# Patient Record
Sex: Female | Born: 1945 | Race: White | Hispanic: No | Smoking: Former smoker
Health system: Southern US, Community
[De-identification: ages and names within clinical notes are randomized; demographics above are authoritative.]

## PROBLEM LIST (undated history)

## (undated) DIAGNOSIS — I10 Essential (primary) hypertension: Secondary | ICD-10-CM

## (undated) DIAGNOSIS — I739 Peripheral vascular disease, unspecified: Secondary | ICD-10-CM

## (undated) DIAGNOSIS — J42 Unspecified chronic bronchitis: Secondary | ICD-10-CM

## (undated) DIAGNOSIS — E785 Hyperlipidemia, unspecified: Secondary | ICD-10-CM

## (undated) HISTORY — DX: Hyperlipidemia, unspecified: E78.5

## (undated) HISTORY — PX: APPENDECTOMY: SHX54

## (undated) HISTORY — DX: Essential (primary) hypertension: I10

## (undated) HISTORY — DX: Peripheral vascular disease, unspecified: I73.9

## (undated) HISTORY — PX: ABDOMINAL HYSTERECTOMY: SHX81

## (undated) HISTORY — PX: EYE SURGERY: SHX253

## (undated) HISTORY — PX: ROTATOR CUFF REPAIR: SHX139

---

## 2004-11-03 ENCOUNTER — Ambulatory Visit (HOSPITAL_COMMUNITY): Admission: RE | Admit: 2004-11-03 | Discharge: 2004-11-03 | Payer: Self-pay | Admitting: Ophthalmology

## 2006-04-04 ENCOUNTER — Ambulatory Visit (HOSPITAL_COMMUNITY): Admission: RE | Admit: 2006-04-04 | Discharge: 2006-04-04 | Payer: Self-pay | Admitting: Ophthalmology

## 2010-12-16 ENCOUNTER — Encounter (INDEPENDENT_AMBULATORY_CARE_PROVIDER_SITE_OTHER): Payer: Medicare Other | Admitting: Vascular Surgery

## 2010-12-16 DIAGNOSIS — I70219 Atherosclerosis of native arteries of extremities with intermittent claudication, unspecified extremity: Secondary | ICD-10-CM

## 2010-12-20 NOTE — Consult Note (Signed)
NEW PATIENT CONSULTATION  Erin Edwards DOB:  11/23/1945                                       12/16/2010 CHART#:[id number]  REASON FOR CONSULTATION:  Bilateral leg claudication.  HISTORY OF PRESENT ILLNESS:  This is an 65 year old patient with complaint of bilateral leg pain.  She apparently is only able to ambulate less than a hundred yards before she begins to have pain in her calf and in her feet.  It appears to be left greater than right but at times is vice versa.  She was seen by Dr. Sherryll Burger at [facility] and it was felt that she had significant peripheral arterial disease and needed a referral.  She denies any rest pain or any type of gangrene or ischemic changes to her feet.  She does note occasionally a bluish hue to her feet which mainly she notes with cold exposure.  PAST MEDICAL HISTORY:  Diabetes, hypertension, and hyperlipidemia.  PAST SURGICAL HISTORY:  Includes an appendectomy, some type of node surgery for sinus disease, abdominal hysterectomy, cataract surgery, some type of ovarian cyst procedure and rotator cuff repair.  SOCIAL HISTORY:  A 40 pack year history of smoking.  Denies any alcohol or illicit drug use.  FAMILY HISTORY:  Mother died of breast cancer.  Father died of an MI and also had diabetes.  REVIEW OF SYSTEMS:  She noted joint pain, muscle pain and pain in the legs with walking.  Otherwise the rest of review of system was marked as negative on her chart.  MEDICATIONS:  She noted Wellbutrin, Flexeril, atorvastatin, Celexa, Cipro, lisinopril/hydrochlorothiazide.  ALLERGIES:  Include codeine, sulfa drugs and penicillin.  PHYSICAL EXAMINATION:  On physical examination, she had a blood pressure of 120/60, heart rate of 73, respirations were 22. GENERAL:  Well-developed, well-nourished, in no apparent distress, mildly obese. HEAD:  Normocephalic, atraumatic. ENT:  Hearing grossly intact.  Oropharynx without any erythema  or exudate.  Scant amounts caries.  Nares without any drainage or any erythema. NECK:  Supple neck.  No nuchal rigidity. EYES:  Pupils were equal, round, reactive to light.  Extraocular movements were intact. PULMONARY:  Symmetric expansion.  Good air movement.  No rales, rhonchi or wheezing. CARDIAC:  Regular rate and rhythm.  Normal S1, S2.  No murmurs, rubs, thrills, gallops. VASCULAR:  Easily palpable upper extremity pulses.  Bilateral femoral pulses.  I could not feel the popliteals.  There were easily palpable dorsalis pedis pulses.  I could not feel posterior tibial pulses, however, in this patient.  The aorta was not palpable due to her obesity.  Carotids were palpable easily.  There was a low intensity left carotid bruit. ABDOMEN:  Soft abdomen, nontender, nondistended.  No guarding, no rebound, no hepatosplenomegaly, obese. MUSCULOSKELETAL:  She had 5/5 strength in all extremities.  There were no findings of ischemia changes in either extremity.  However, she did have a cyanotic hue on bilateral soles. NEUROLOGIC:  Cranial nerves II-XII were intact.  Motor strength was as listed as above.  Sensation grossly intact in all extremities. PSYCH:  Judgment was intact.  Mood and affect were appropriate for her clinical situation. SKIN:  There are no obvious rashes. EXTREMITIES:  As listed above. LYMPHATICS:  No cervical, axillary or inguinal lymphadenopathy.  MEDICAL DECISION MAKING:  This is a 65 year old patient who presents with what is classically consistent with short  distance claudication. However, I do feel easily dorsalis pedis pulses bilaterally.  This patient additionally had a bruit in her neck and some symptomatology the near may not be consistent with possible left subclavian steal.  I have some suspicion that the outside ABIs may be inaccurate.  I also have no description of the waveforms, so I think it needs to be repeated.  So my recommendation is to proceed  first with bilateral lower extremity ABIs and bilateral carotid duplex and if that does indicate any disease process, round, then she may eventually need also angiogram to evaluate bilateral lower extremities and also possibly a left arm arterial duplex to evaluate for any arterial disease in the upper extremity.  I think also when she returns, I will send her off for screening labs to look for a possible etiology for possible Raynaud's syndrome in this patient. We discussed the management in her case.  She is going to start an aspirin.  Also we are going to start her on Pletal.  We discussed extensively the nature of a walking plan and we discussed the natural history of symptomatic intermittent claudication and all these studies hopefully will get done within the next 2-4 weeks and then will have her follow up afterwards.  I reiterated the importance of maximal medical management including smoking cessation.    Fransisco Hertz, MD Electronically Signed  BLC/MEDQ  D:  12/16/2010  Edwards:  12/20/2010  Job:  [id number]  cc:   Kirstie Peri, MD

## 2011-01-05 ENCOUNTER — Encounter: Payer: Self-pay | Admitting: Vascular Surgery

## 2011-01-18 NOTE — Progress Notes (Signed)
[facility] [place]  Established Intermittent Claudication  History of Present Illness  Erin Edwards is a 65 y.o. female who presents with chief complaint: B leg claudication and carotid bruit.  She presents today for her non-invasive studies and follow-up.  The patient's symptoms are stable.  The patient's symptoms are: short distance claudication that do not interfere with her ADLs.  The patient's treatment regimen currently included: maximal medical management and walking plan.  Past Medical History, Past Surgical History, Social History, Family History, Medications, Allergies, and Review of Systems are unchanged from previous evaluation on 12/16/10.  Physical Examination  Filed Vitals:   01/20/11 1017  BP: 134/72  Pulse: 100    General: A&O x 3, WDWN, obese  Pulmonary: Sym exp, good air movt, CTAB, no rales, rhonchi, & wheezing  Cardiac: RRR, Nl S1, S2, no Murmurs, rubs or gallops  Vascular: Vessel Right Left  Radial Palpable Palpable  Ulnary Palpable Palpable  Brachial Palpable Palpable  Carotid Palpable, without bruit Palpable, without bruit  Aorta Non-palpable N/A  Femoral Palpable Palpable  Popliteal Non-palpable Non-palpable  PT Palpable Palpable  DP Palpable Palpable   Musculoskeletal: M/S 5/5 throughout, Extremities without ischemic changes   Neurologic: Pain and light touch intact in extremities, Motor exam as listed above  Non-Invasive Vascular Imaging ABI (Date: 01/20/11)  RLE: 0.74, Biphasic DP & PT  LLE: 0.74, Biphasic DP & PT  Carotid Duplex (Date: 01/20/11)  No carotid stenosis Bilaterally  Bilateral VA antegrade and patent   Medical Decision Making  Erin Edwards is a 65 y.o. female who presents with: B leg intermittent claudication without evidence of critical limb ischemia.  She has no evidence of a carotid stenosis.   As the patient does not have life-style limiting claudication, I do not recommend intervention, as  the risks exceed the benefits.  I discussed with the patient the natural history of intermittent claudication: 75% of patients have stable or improved symptoms in a year an only 2% require amputation. Eventually 20% may require intervention in a year.  I discussed in depth with the patient the nature of atherosclerosis, and emphasized the importance of maximal medical management including strict control of blood pressure, blood glucose, and lipid levels, obtaining regular exercise, and cessation of smoking.  The patient is aware that without maximal medical management the underlying atherosclerotic disease process will progress, limiting the benefit of any interventions.  I discussed in depth with the patient a walking plan and how to execute such.  The patient is not interested in starting Pletal.  The patient will follow up in 6 month weeks with the following studies BLE ABI.    Thank you for allowing [place] to participate in this patient's care.  Leonides Sake, MD [facility] Office: [phone] Pager: [phone]

## 2011-01-20 ENCOUNTER — Encounter (INDEPENDENT_AMBULATORY_CARE_PROVIDER_SITE_OTHER): Payer: Medicare Other

## 2011-01-20 ENCOUNTER — Other Ambulatory Visit (INDEPENDENT_AMBULATORY_CARE_PROVIDER_SITE_OTHER): Payer: Medicare Other

## 2011-01-20 ENCOUNTER — Ambulatory Visit (INDEPENDENT_AMBULATORY_CARE_PROVIDER_SITE_OTHER): Payer: Medicare Other | Admitting: Vascular Surgery

## 2011-01-20 ENCOUNTER — Encounter: Payer: Self-pay | Admitting: Vascular Surgery

## 2011-01-20 VITALS — BP 134/72 | HR 100

## 2011-01-20 DIAGNOSIS — R0989 Other specified symptoms and signs involving the circulatory and respiratory systems: Secondary | ICD-10-CM

## 2011-01-20 DIAGNOSIS — I739 Peripheral vascular disease, unspecified: Secondary | ICD-10-CM

## 2011-01-20 NOTE — Progress Notes (Signed)
4 wk f/u for PVD and carotid bruit/ vasc labs done today: ABI's and Carotid Doppler

## 2011-02-02 NOTE — Procedures (Unsigned)
CAROTID DUPLEX EXAM  INDICATION:  Carotid bruit.  HISTORY: Diabetes:  Yes. Cardiac:  No. Hypertension:  Yes. Smoking:  Yes. Previous Surgery:  No. CV History:  The patient is currently asymptomatic. Amaurosis Fugax No, Paresthesias No, Hemiparesis No                                      RIGHT             LEFT Brachial systolic pressure:         161               153 Brachial Doppler waveforms:         WNL               WNL Vertebral direction of flow:        Antegrade         Antegrade DUPLEX VELOCITIES (cm/sec) CCA peak systolic                   77                87 ECA peak systolic                   114               120 ICA peak systolic                   80                69 ICA end diastolic                   28                24 PLAQUE MORPHOLOGY:                  Heterogeneous     Heterogeneous PLAQUE AMOUNT:                      Moderate          Moderate PLAQUE LOCATION:                    ICA               ICA  IMPRESSION:  Bilaterally no hemodynamically significant stenosis. Bilateral antegrade vertebrals.  ___________________________________________ Fransisco Hertz, MD  OD/MEDQ  D:  01/20/2011  T:  01/20/2011  Job:  [id number]

## 2011-07-21 ENCOUNTER — Ambulatory Visit: Payer: Medicare Other | Admitting: Vascular Surgery

## 2014-06-22 ENCOUNTER — Other Ambulatory Visit: Payer: Self-pay | Admitting: Surgery

## 2014-06-22 DIAGNOSIS — C50911 Malignant neoplasm of unspecified site of right female breast: Secondary | ICD-10-CM

## 2014-06-30 ENCOUNTER — Ambulatory Visit
Admission: RE | Admit: 2014-06-30 | Discharge: 2014-06-30 | Disposition: A | Discharge status: Home / Self Care | Payer: Medicare Other | Source: Ambulatory Visit | Attending: Surgery | Admitting: Surgery

## 2014-06-30 DIAGNOSIS — C50911 Malignant neoplasm of unspecified site of right female breast: Secondary | ICD-10-CM

## 2014-06-30 MED ORDER — GADOBENATE DIMEGLUMINE 529 MG/ML IV SOLN
19.0000 mL | Freq: Once | INTRAVENOUS | Status: AC | PRN
  Administered 2014-06-30: 19 mL via INTRAVENOUS

## 2014-07-01 ENCOUNTER — Other Ambulatory Visit: Payer: Self-pay | Admitting: Surgery

## 2014-07-01 DIAGNOSIS — R928 Other abnormal and inconclusive findings on diagnostic imaging of breast: Secondary | ICD-10-CM

## 2014-07-07 ENCOUNTER — Ambulatory Visit
Admission: RE | Admit: 2014-07-07 | Discharge: 2014-07-07 | Disposition: A | Discharge status: Home / Self Care | Payer: Medicare Other | Source: Ambulatory Visit | Attending: Surgery | Admitting: Surgery

## 2014-07-07 DIAGNOSIS — R928 Other abnormal and inconclusive findings on diagnostic imaging of breast: Secondary | ICD-10-CM

## 2014-07-07 MED ORDER — GADOBENATE DIMEGLUMINE 529 MG/ML IV SOLN
19.0000 mL | Freq: Once | INTRAVENOUS | Status: AC | PRN
  Administered 2014-07-07: 19 mL via INTRAVENOUS

## 2014-12-23 ENCOUNTER — Telehealth: Payer: Self-pay | Admitting: *Deleted

## 2014-12-23 NOTE — Telephone Encounter (Signed)
Received faxed referral from Dr. Jaclyn Prime office for patient to see Dr. Isidore Moos.  Called patient for appointment.  Patient states she has seen by Dr. Isidore Moos in [place] and has been released by Dr. Isidore Moos & Dr. Tressie Stalker.  LVM with Dr. Jaclyn Prime nurse with former information.

## 2016-01-25 IMAGING — MR MR RT BREAST BX W LOC DEV 1ST LESION IMAGE BX SPEC MR GUIDE
10 of 13 series · 33 of 48 positions shown · IV contrast (19ml Multihance)
Comparison: Previous exams.

ADDENDUM:
Pathology revealed atypical ductal hyperplasia in the right breast
and low grade ductal carcinoma in situ with calcifications in the
left breast. This was found to be concordant by Dr. Kusli Montazni.
Pathology was discussed with the patient by telephone. She reported
doing well after the biopsies with tenderness at the sites. Post
biopsy instructions and care were reviewed and her questions were
answered. She has previously been diagnosed with right breast ductal
carcinoma in situ and has seen Dr. Krish Sibrian in consultation.
The results of the biopsies were called and faxed to his office. She
is to follow her outlined treatment plan. My number was provided for
future questions or concerns.

Pathology results reported by Timm Landin RN, BSN on July 09, 2014.
CLINICAL DATA: 2.5 cm area of clumped non mass enhancement in the
posterior aspect of the lower outer quadrant of the right breast on
a recent staging breast MRI. Recently diagnosed ductal carcinoma in
situ in the anterior aspect of the outer right breast.
EXAM:
MRI GUIDED CORE NEEDLE BIOPSY OF THE RIGHT BREAST
TECHNIQUE: Multiplanar, multisequence MR imaging of the right breast was
performed both before and after administration of intravenous
contrast.
CONTRAST:  19 cc MultiHance

[Series 2: axial pre-cm · axial · non-contrast · 1.3mm · 0.99mm/px · z∈[-83,+103]mm · 3 of 144 slices shown]
[im 1/144]
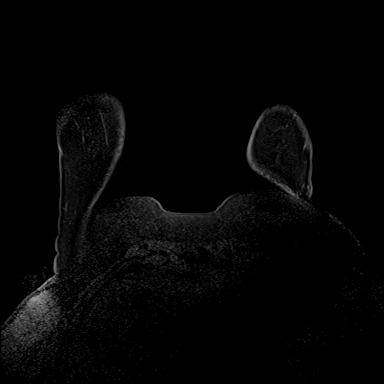
[im 72/144]
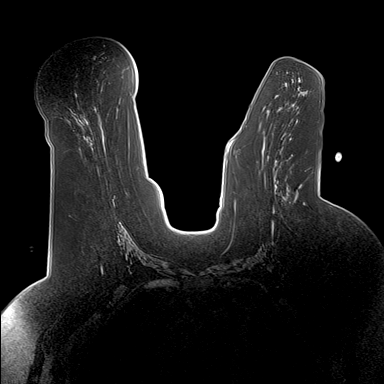
[im 144/144]
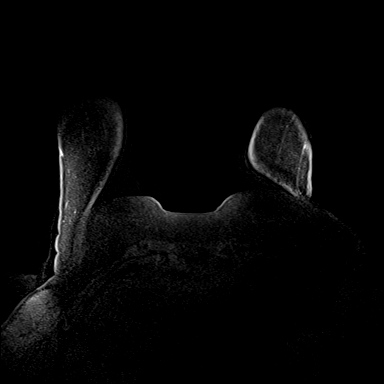

[Series 3: axial post 20 · axial · 1.3mm · 0.99mm/px · z∈[-83,+103]mm · 3 of 144 slices shown (1 of 2)]
[im 1/144]
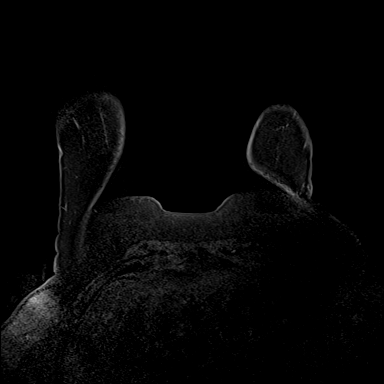
[im 72/144]
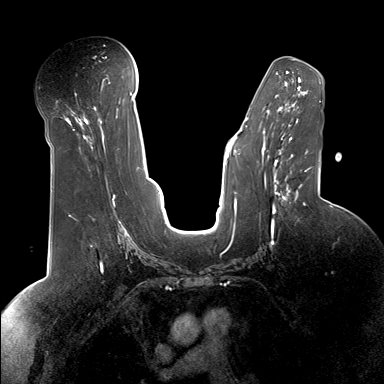
[im 144/144]
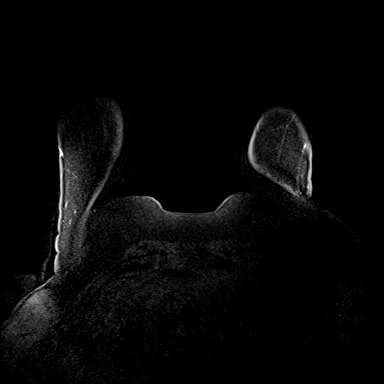

[Series 4: axial post 20 · axial · 1.3mm · 0.99mm/px · z∈[-83,+103]mm · 3 of 144 slices shown (2 of 2)]
[im 1/144]
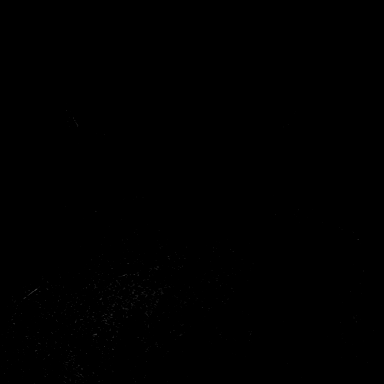
[im 72/144]
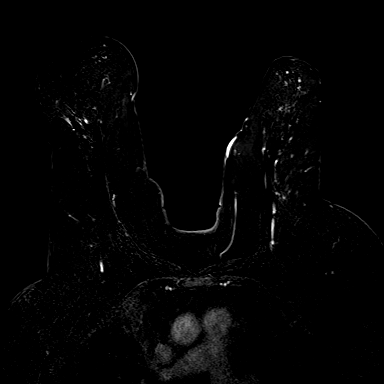
[im 144/144]
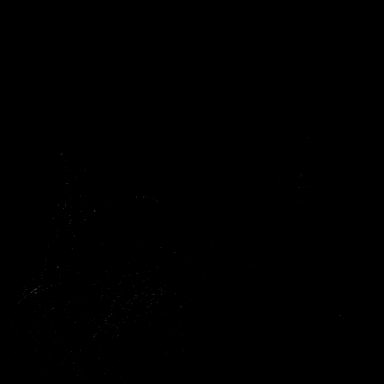

[Series 7: axial post 3 · axial · 1.3mm · 0.99mm/px · z∈[-83,+103]mm · 3 of 144 slices shown (1 of 2)]
[im 1/144]
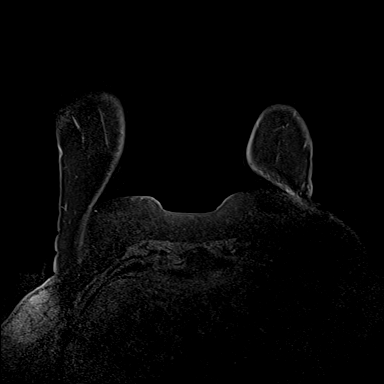
[im 72/144]
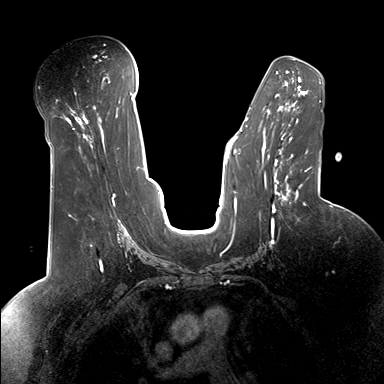
[im 144/144]
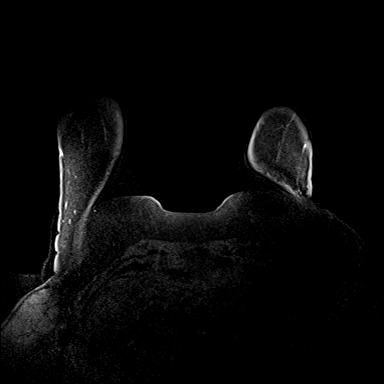

[Series 8: axial post 3 · axial · 1.3mm · 0.99mm/px · z∈[-83,+103]mm · 4 of 144 slices shown (2 of 2)]
[im 1/144]
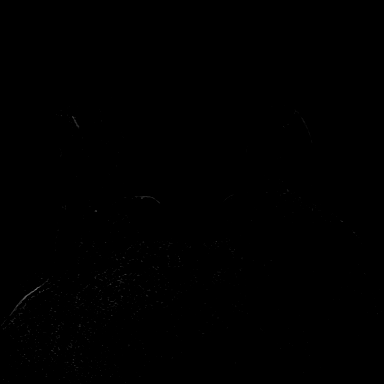
[im 48/144]
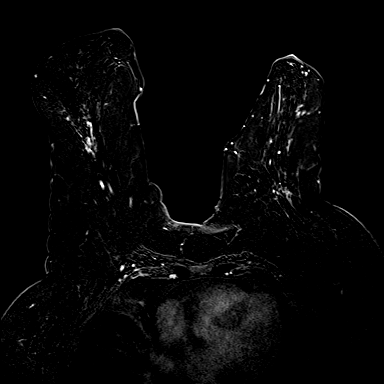
[im 96/144]
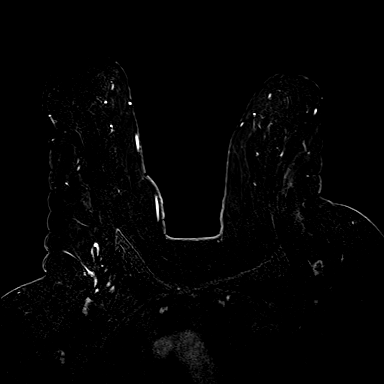
[im 144/144]
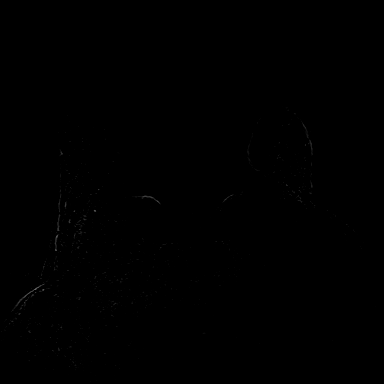

[Series 9: axial post 5 · axial · 1.3mm · 0.99mm/px · z∈[-83,+103]mm · 4 of 144 slices shown (1 of 2)]
[im 1/144]
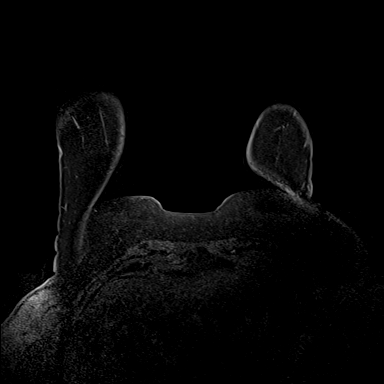
[im 48/144]
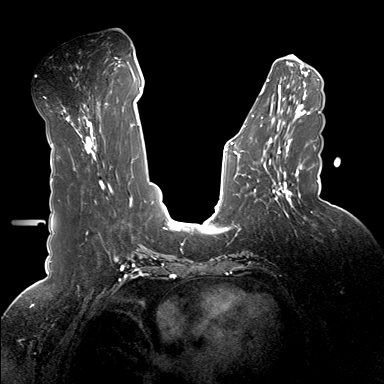
[im 96/144]
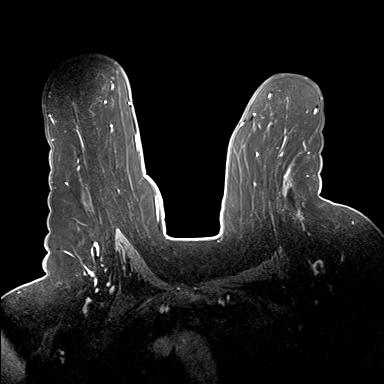
[im 144/144]
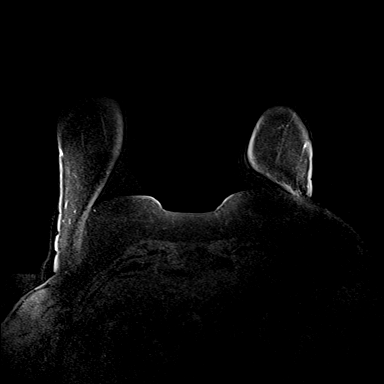

[Series 10: axial post 5 · axial · 1.3mm · 0.99mm/px · z∈[-83,+103]mm · 4 of 144 slices shown (2 of 2)]
[im 1/144]
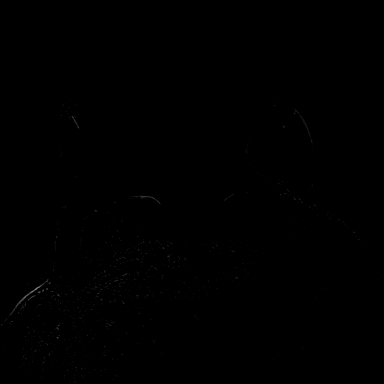
[im 48/144]
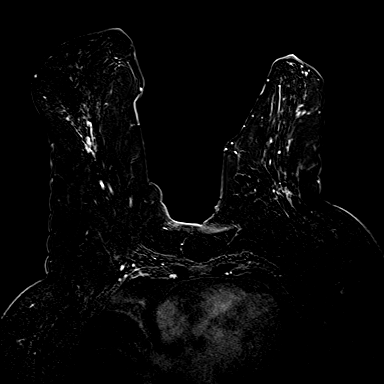
[im 96/144]
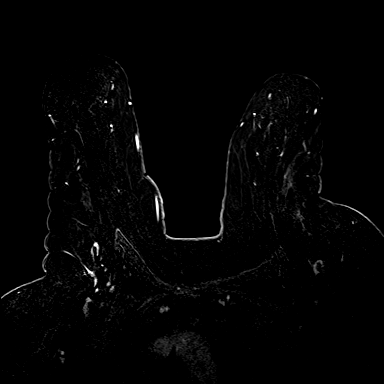
[im 144/144]
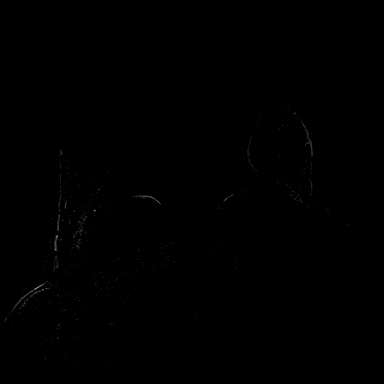

[Series 12: axial confirmation · axial · 1.3mm · 0.99mm/px · z∈[-83,+103]mm · 4 of 144 slices shown]
[im 1/144]
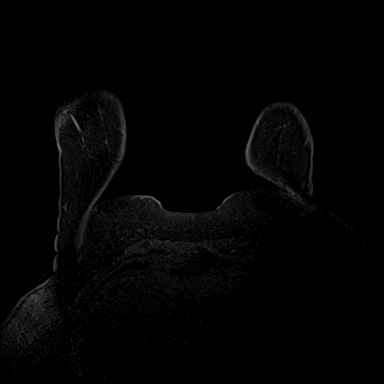
[im 48/144]
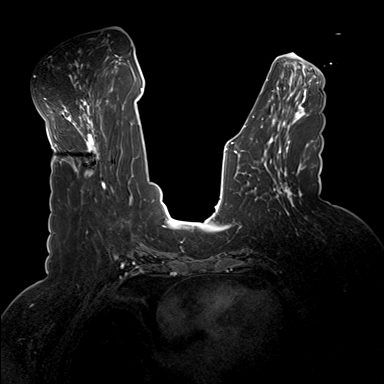
[im 96/144]
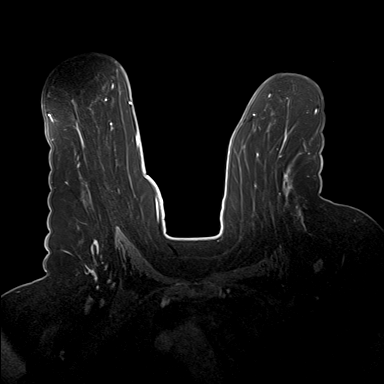
[im 144/144]
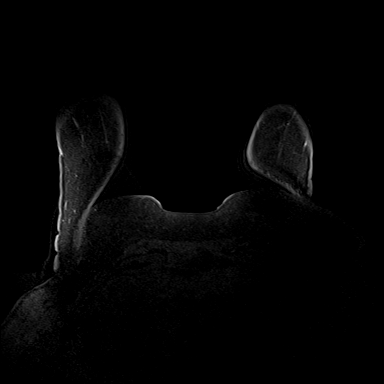

[Series 13: axial confirmation_sub · axial · 1.3mm · 0.99mm/px · z∈[-83,+103]mm · 4 of 144 slices shown]
[im 1/144]
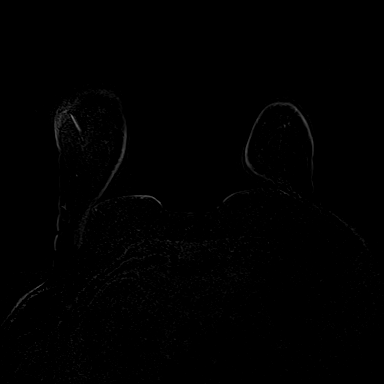
[im 48/144]
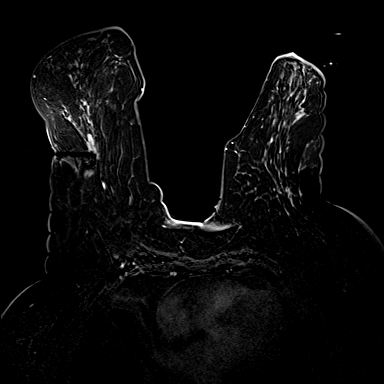
[im 96/144]
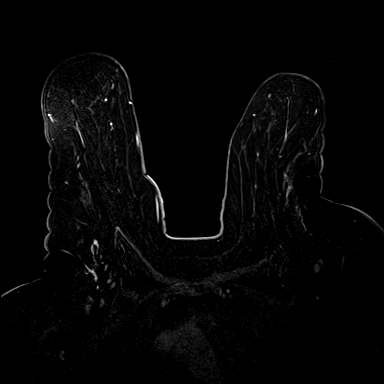
[im 144/144]
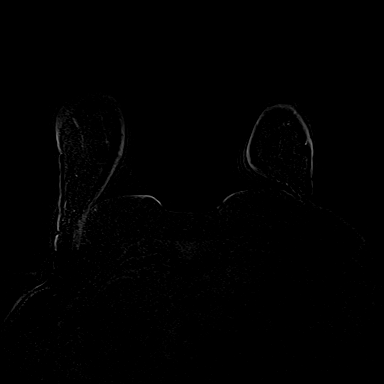

[Series 14: axial post clip · axial · 1.3mm · 0.99mm/px · 1 of 144 slices shown]
[im 1/144]
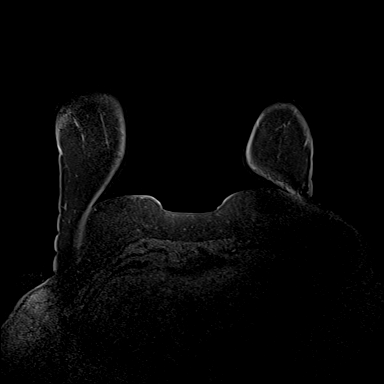

[33 of 48 positions shown; findings below may reference images not displayed]

FINDINGS: I met with the patient, and we discussed the procedure of MRI guided
biopsy, including risks, benefits, and alternatives. Specifically,
we discussed the risks of infection, bleeding, tissue injury, clip
migration, and inadequate sampling. Informed, written consent was
given. The usual time out protocol was performed immediately prior
to the procedure.

Using sterile technique, 2% Lidocaine, MRI guidance, and a 9 gauge
vacuum assisted device, biopsy was performed of the recently
demonstrated 2.5 cm area of clumped non mass enhancement in the
posterior aspect of the lower outer quadrant of the right breast
using a lateral approach. At the conclusion of the procedure, a
dumbbell-shaped tissue marker clip was deployed into the biopsy
cavity. Follow-up 2-view mammogram was performed and dictated
separately.
IMPRESSION: MRI guided biopsy of a 2.5 cm area of clumped non mass enhancement
in the posterior aspect of the lower outer quadrant of the right
breast. No apparent complications.

## 2016-04-26 ENCOUNTER — Ambulatory Visit (INDEPENDENT_AMBULATORY_CARE_PROVIDER_SITE_OTHER): Payer: Medicare Other | Admitting: Neurology

## 2016-04-26 ENCOUNTER — Encounter (INDEPENDENT_AMBULATORY_CARE_PROVIDER_SITE_OTHER): Payer: Self-pay | Admitting: Neurology

## 2016-04-26 DIAGNOSIS — R200 Anesthesia of skin: Secondary | ICD-10-CM | POA: Diagnosis not present

## 2016-04-26 DIAGNOSIS — G5620 Lesion of ulnar nerve, unspecified upper limb: Secondary | ICD-10-CM | POA: Insufficient documentation

## 2016-04-26 DIAGNOSIS — G5623 Lesion of ulnar nerve, bilateral upper limbs: Secondary | ICD-10-CM

## 2016-04-26 DIAGNOSIS — Z0289 Encounter for other administrative examinations: Secondary | ICD-10-CM

## 2016-04-26 NOTE — Progress Notes (Signed)
   STUDY DATE: 04/26/16 PATIENT NAME: Erin Edwards DOB: [birth year] MRN: [id number]  Referred by Dr. Monico Blitz [facility]; fax [phone])  HISTORY:  Erin Edwards is a 70 year old Left handed woman with numbness in the left 5th finger and adjacent palm and milder intermittent numbness in the same distribution on the right. On exam, she had 4/5 weakness in the ulnar innervated muscles of the left hand. She had positive Tinel signs at both elbows.  NERVE CONDUCTION STUDIES:  The left median motor response had normal distal latency, amplitude and conduction velocity. The left ulnar motor response had a normal distal latency but reduced amplitude and markedly reduced conduction velocity around the elbow. The left median sensory response was normal. The left ulnar sensory response was nonresponsive  The right median motor response had a normal distal latency, amplitude and conduction velocity. The right ulnar motor response had a normal distal latency and mildly reduced amplitude and mildly reduced conduction velocity around the elbow. The right median sensory response was normal. The right ulnar are in true response had a minimally delayed distal latency and moderately reduced amplitude.  Median and ulnar F-wave responses were normal.  EMG STUDIES:  Needle EMG of the left deltoid, triceps, biceps, flexor carpi ulnaris, extensor digitorum communis communis and abductor pollicis brevis showed no continuous activity and normal motor unit morphology and recruitment. The first dorsal interosseous (ulnar innervated) showed no spontaneous activity. There was an increased number of polyphasic motor units with neuropathic recruitment.  Needle EMG of the right abductor pollicis brevis and right first dorsal interosseous muscles showed no spontaneous activity and normal motor unit morphology and recruitment.  IMPRESSION:  This nerve conduction and EMG study showed the following: 1.   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   Ulnar nerve transposition could be considered.   2.   Mild right ulnar neuropathy at the elbow. 3.   No evidence of radiculopathy.   Mahoganie Basher A. Felecia Shelling, MD, PhD Certified in Neurology, [facility], Sleep Medicine, Pain Medicine and Neuroimaging  [facility] [address], [place], [place] [address] [phone]

## 2017-04-24 ENCOUNTER — Encounter: Payer: Self-pay | Admitting: Pulmonary Disease

## 2017-04-24 ENCOUNTER — Ambulatory Visit (INDEPENDENT_AMBULATORY_CARE_PROVIDER_SITE_OTHER): Payer: Medicare PPO | Admitting: Pulmonary Disease

## 2017-04-24 VITALS — BP 130/74 | HR 111 | Ht 62.0 in | Wt 193.0 lb

## 2017-04-24 DIAGNOSIS — R918 Other nonspecific abnormal finding of lung field: Secondary | ICD-10-CM

## 2017-04-24 DIAGNOSIS — J449 Chronic obstructive pulmonary disease, unspecified: Secondary | ICD-10-CM

## 2017-04-24 DIAGNOSIS — Z87891 Personal history of nicotine dependence: Secondary | ICD-10-CM

## 2017-04-24 NOTE — Progress Notes (Signed)
   Subjective:    Patient ID: Erin Edwards, female    DOB: 10/16/1945, 71 y.o.   MRN: [id number]  HPI    Review of Systems  Constitutional: Positive for unexpected weight change. Negative for fever.  HENT: Positive for dental problem. Negative for congestion, ear pain, nosebleeds, postnasal drip, rhinorrhea, sinus pressure, sneezing, sore throat and trouble swallowing.   Eyes: Negative for redness and itching.  Respiratory: Positive for cough, chest tightness, shortness of breath and wheezing.   Cardiovascular: Negative for palpitations and leg swelling.  Gastrointestinal: Positive for vomiting. Negative for nausea.  Genitourinary: Negative for dysuria.  Musculoskeletal: Negative for joint swelling.  Skin: Negative for rash.  Allergic/Immunologic: Negative.  Negative for environmental allergies, food allergies and immunocompromised state.  Neurological: Negative for headaches.  Hematological: Does not bruise/bleed easily.  Psychiatric/Behavioral: Negative for dysphoric mood. The patient is nervous/anxious.        Objective:   Physical Exam        Assessment & Plan:

## 2017-04-24 NOTE — Patient Instructions (Signed)
Will schedule PET scan  Follow up in 4 weeks with Dr. Halford Chessman or Nurse Practitioner

## 2017-04-24 NOTE — Progress Notes (Signed)
Past surgical history She  has a past surgical history that includes Appendectomy; Abdominal hysterectomy; Eye surgery; and Rotator cuff repair.  Family history Her family history includes Cancer in her mother; Diabetes in her father; Heart disease in her father.  Social history She  reports that she quit smoking about 3 years ago. Her smoking use included Cigarettes. She has a 75.00 pack-year smoking history. She has never used smokeless tobacco. She reports that she does not drink alcohol or use drugs.  Allergies  Allergen Reactions  . Codeine   . Penicillins   . Sulfa Antibiotics    ROS: 12 point ROS negative except below  Current Outpatient Prescriptions on File Prior to Visit  Medication Sig  . ACCU-CHEK AVIVA PLUS test strip   . ACCU-CHEK FASTCLIX LANCETS MISC   . atorvastatin (LIPITOR) 10 MG tablet Take 10 mg by mouth daily.    Marland Kitchen buPROPion (WELLBUTRIN) 100 MG tablet Take 100 mg by mouth 2 (two) times daily.    Marland Kitchen lisinopril (PRINIVIL,ZESTRIL) 40 MG tablet Take 40 mg by mouth daily.     No current facility-administered medications on file prior to visit.     Chief Complaint  Patient presents with  . pulm consult    pt referred by Dr. Manuella Ghazi for right upper lung mass. Pt has SOB generally and with exertion worsen over last month. Pt has productive cough, wheezing and trouble breathing.    Pulmonary test CT chest 04/12/17 >> CAD, Rt hilar mass with consolidation, small Rt effusion, hilar LAN, Rt 7th rib met  Past medical history She  has a past medical history of Claudication ([place]); Diabetes mellitus; Hyperlipidemia; and Hypertension.  Vital signs BP 130/74 (BP Location: Left Arm, Cuff Size: Normal)   Pulse (!) 111   Ht '5\' 2"'$  (1.575 m)   Wt 193 lb (87.5 kg)   SpO2 92%   BMI 35.30 kg/m   History of present illness Erin Edwards is a 71 y.o. female former smoker with with lung mass.  She was started getting more short of breath in August.  She also also have right  sided chest pain and cough.  She had CXR which showed Rt upper lung consolidation.  She was tx with prednisone and antibiotics.  F/u CXR showed persistence of consolidation.  She then had CT chest w/o contrast with details above.  She has history of breast cancer and was treated just with surgery.  Her husband had histoplasmosis in the 1990's.  She is from [place], but then lived in [place] before moving to [place].  Her son lives in [place].  She denies animal/bird exposure.  She quit smoking 3 yrs ago.  She denies fever, hemoptysis, change in vision, weight loss, or change in voice.  She has been using breo, but doesn't seem to be helping as much as before.    Physical exam  General - No distress Eyes - pupils reactive, wears glasses ENT - No sinus tenderness, no oral exudate, no LAN, no thyromegaly, TM clear, pupils equal/reactive, poor dentition Cardiac - s1s2 regular, no murmur, pulses symmetric Chest - decreased BS Rt upper lung field, mild tenderness along Rt chest Back - No focal tenderness Abd - Soft, non-tender, no organomegaly, + bowel sounds Ext - No edema Neuro - Normal strength, cranial nerves intact Skin - No rashes Psych - Normal mood, and behavior   Discussion 71 yo female former smoker with Rt hilar mass and concern for endobronchial lesion.  She also  has right hilar adenopathy, satellite Rt upper lung nodule, and probable metastatic lesion to Rt 7 th rib.  Main concern is for primary lung cancer.  I am less concerned that this is recurrence of breast cancer.  I explained it is unlikely this is related to her husbands history of histoplasmosis.   Assessment/plan  Lung mass. - will arrange for PET scan to better determine optimal approach for biopsy >> either bronchoscopy +/- EBUS or referral to interventional radiology - she will d/w her son about whether she would like to pursue biopsy and therapy in [place] or in [place] where she would have  more social support from family  COPD with chronic bronchitis. - continue breo   Patient Instructions  Will schedule PET scan  Follow up in 4 weeks with Dr. Halford Chessman or Nurse Practitioner    Chesley Mires, MD [place] [facility] Pager:  [phone] 04/24/2017, 12:38 PM

## 2017-05-04 ENCOUNTER — Encounter (HOSPITAL_COMMUNITY): Payer: Medicare PPO

## 2017-05-16 ENCOUNTER — Ambulatory Visit (HOSPITAL_COMMUNITY)
Admission: RE | Admit: 2017-05-16 | Discharge: 2017-05-16 | Disposition: A | Discharge status: Home / Self Care | Payer: Medicare PPO | Source: Ambulatory Visit | Attending: Pulmonary Disease | Admitting: Pulmonary Disease

## 2017-05-16 DIAGNOSIS — M899 Disorder of bone, unspecified: Secondary | ICD-10-CM | POA: Insufficient documentation

## 2017-05-16 DIAGNOSIS — R918 Other nonspecific abnormal finding of lung field: Secondary | ICD-10-CM | POA: Insufficient documentation

## 2017-05-16 DIAGNOSIS — E279 Disorder of adrenal gland, unspecified: Secondary | ICD-10-CM | POA: Insufficient documentation

## 2017-05-16 DIAGNOSIS — K769 Liver disease, unspecified: Secondary | ICD-10-CM | POA: Insufficient documentation

## 2017-05-16 DIAGNOSIS — R1900 Intra-abdominal and pelvic swelling, mass and lump, unspecified site: Secondary | ICD-10-CM | POA: Diagnosis not present

## 2017-05-16 DIAGNOSIS — M799 Soft tissue disorder, unspecified: Secondary | ICD-10-CM | POA: Insufficient documentation

## 2017-05-16 DIAGNOSIS — J189 Pneumonia, unspecified organism: Secondary | ICD-10-CM | POA: Insufficient documentation

## 2017-05-16 DIAGNOSIS — Z87891 Personal history of nicotine dependence: Secondary | ICD-10-CM | POA: Diagnosis present

## 2017-05-16 LAB — GLUCOSE, CAPILLARY
Glucose-Capillary: 173 mg/dL — ABNORMAL HIGH (ref 65–99)
Glucose-Capillary: 194 mg/dL — ABNORMAL HIGH (ref 65–99)

## 2017-05-16 MED ORDER — FLUDEOXYGLUCOSE F - 18 (FDG) INJECTION
9.6000 | Freq: Once | INTRAVENOUS | Status: AC | PRN
  Administered 2017-05-16: 9.6 via INTRAVENOUS

## 2017-05-18 ENCOUNTER — Telehealth: Payer: Self-pay | Admitting: Pulmonary Disease

## 2017-05-18 DIAGNOSIS — C3490 Malignant neoplasm of unspecified part of unspecified bronchus or lung: Secondary | ICD-10-CM

## 2017-05-18 NOTE — Telephone Encounter (Signed)
PET scan 05/16/17 >> 6.6 cm mass RUL 15.2 SUV, mediastinal LAN up to 10 mm SUV 4.6, Rt axillary LAN 1.3 cm SUV 6.9, 2.4 cm Lt lobe of liver SUV 8.4, 10 mm Lt adrenal SUV 4.6, Rt 7th rib SUV 11.2   Results d/w pt.  Will arrange for IR to assess biopsy of Rt axillary node of liver lesion.  If these aren't amenable to biopsy, will then arrange for bronchoscopy.

## 2017-05-22 ENCOUNTER — Ambulatory Visit (INDEPENDENT_AMBULATORY_CARE_PROVIDER_SITE_OTHER): Payer: Medicare PPO | Admitting: Adult Health

## 2017-05-22 ENCOUNTER — Encounter: Payer: Self-pay | Admitting: Adult Health

## 2017-05-22 DIAGNOSIS — R918 Other nonspecific abnormal finding of lung field: Secondary | ICD-10-CM | POA: Insufficient documentation

## 2017-05-22 NOTE — Patient Instructions (Signed)
Set up for CT Biopsy as discussed.  Once results are back will decide on next step.  Follow up with Dr. Halford Chessman  In 2 months and As needed

## 2017-05-22 NOTE — Progress Notes (Signed)
I have reviewed and agree with assessment/plan.  Chesley Mires, MD [facility] Pulmonary/Critical Care 05/22/2017, 3:35 PM Pager:  [phone]

## 2017-05-22 NOTE — Progress Notes (Signed)
_0  ID: Erin Edwards, female    DOB: 07/07/1946, 71 y.o.   MRN: [id number]  Chief Complaint  Patient presents with  . Follow-up    Referring provider: Monico Blitz, MD  HPI: 71 yo female former smoker seen for pulmonary consult for lung mass on 04/24/17    TEST  CT chest 04/12/17 >> CAD, Rt hilar mass with consolidation, small Rt effusion, hilar LAN, Rt 7th rib met PET scan 05/16/17 >> 6.6 cm mass RUL 15.2 SUV, mediastinal LAN up to 10 mm SUV 4.6, Rt axillary LAN 1.3 cm SUV 6.9, 2.4 cm Lt lobe of liver SUV 8.4, 10 mm Lt adrenal SUV 4.6, Rt 7th rib SUV 11.2   05/22/2017 Follow up : Lung mass  Patient presents for a 3-week follow-up.  Patient was seen last visit for abnormal CT chest that showed a right hilar mass with consolidation and small right pleural effusion.  Patient was set up for a PET scan that showed a 6.6 cm hypermetabolic right upper lobe mass positive mediastinal lymphadenopathy.  Hypermetabolic bone metastasis liver metastasis and left adrenal nodule.  Hypermetabolic left upper quadrant soft tissue density in the gastro splenic ligament consistent with metastatic disease.  There was several tiny subcentimeter hypermetabolic nodules in the left lower abdominal wall subcutaneous fat that were nonspecific. Pt is accompanied by her son from [place] . Pt lives alone and may need to go up to [place] for treatment . They are trying to get as much info as possible.  We went over the CT scan , PET scan results. Discussed need to move forward with biopsy .  Will try to get dx with CT guided biopsy of axillary lymph node (for staging )  She continues to have dry cough , dyspnea and weight loss.  No PFT on record.  Does have some rib and back pain .  No hemoptyiss , chest pain , orthopnea or edema. , no calf pain .    Allergies  Allergen Reactions  . Codeine   . Penicillins   . Sulfa Antibiotics     Immunization History  Administered Date(s) Administered  . Influenza Split  03/26/2015, 04/24/2016    Past Medical History:  Diagnosis Date  . Claudication ([place])   . Diabetes mellitus   . Hyperlipidemia   . Hypertension     Tobacco History: History  Smoking Status  . Former Smoker  . Packs/day: 1.50  . Years: 50.00  . Types: Cigarettes  . Quit date: 2015  Smokeless Tobacco  . Never Used   Counseling given: Not Answered   Outpatient Encounter Prescriptions as of 05/22/2017  Medication Sig  . ACCU-CHEK AVIVA PLUS test strip   . ACCU-CHEK FASTCLIX LANCETS MISC   . amlodipine-atorvastatin (CADUET) 10-10 MG tablet Take 1 tablet by mouth daily.  Marland Kitchen atorvastatin (LIPITOR) 10 MG tablet Take 10 mg by mouth daily.    Marland Kitchen buPROPion (WELLBUTRIN) 100 MG tablet Take 100 mg by mouth 2 (two) times daily.    . cholecalciferol (VITAMIN D) 400 units TABS tablet Take 400 Units by mouth.  . Dulaglutide (TRULICITY) 4.09 WJ/1.9JY SOPN Inject into the skin.  . fluticasone furoate-vilanterol (BREO ELLIPTA) 100-25 MCG/INH AEPB Inhale 1 puff into the lungs daily.  Marland Kitchen gabapentin (NEURONTIN) 300 MG capsule Take 300 mg by mouth 3 (three) times daily.  Marland Kitchen lisinopril (PRINIVIL,ZESTRIL) 40 MG tablet Take 40 mg by mouth daily.    . meloxicam (MOBIC) 15 MG tablet Take 15 mg by mouth daily.  Marland Kitchen  rosuvastatin (CRESTOR) 10 MG tablet Take 10 mg by mouth daily.  . sertraline (ZOLOFT) 100 MG tablet Take 100 mg by mouth daily.   No facility-administered encounter medications on file as of 05/22/2017.      Review of Systems  Constitutional:   No  weight loss, night sweats,  Fevers, chills,  +fatigue, or  lassitude.  HEENT:   No headaches,  Difficulty swallowing,  Tooth/dental problems, or  Sore throat,                No sneezing, itching, ear ache, nasal congestion, post nasal drip,   CV:  No chest pain,  Orthopnea, PND, swelling in lower extremities, anasarca, dizziness, palpitations, syncope.   GI  No heartburn, indigestion, abdominal pain, nausea, vomiting, diarrhea, change in bowel  habits, loss of appetite, bloody stools.   Resp:    No chest wall deformity  Skin: no rash or lesions.  GU: no dysuria, change in color of urine, no urgency or frequency.  No flank pain, no hematuria   MS:  +back pain    Physical Exam  There were no vitals taken for this visit.  GEN: A/Ox3; pleasant , NAD, obese    HEENT:  [place]/AT,  EACs-clear, TMs-wnl, NOSE-clear, THROAT-clear, no lesions, no postnasal drip or exudate noted. Poor dentition   NECK:  Supple w/ fair ROM; no JVD; normal carotid impulses w/o bruits; no thyromegaly or nodules palpated; no lymphadenopathy.    RESP  Clear  P & A; w/o, wheezes/ rales/ or rhonchi. no accessory muscle use, no dullness to percussion  CARD:  RRR, no m/r/g, no peripheral edema, pulses intact, no cyanosis or clubbing.  GI:   Soft & nt; nml bowel sounds; no organomegaly or masses detected.   Musco: Warm bil, no deformities or joint swelling noted.   Neuro: alert, no focal deficits noted.    Skin: Warm, no lesions or rashes    Lab Results:  CBC No results found for: WBC, RBC, HGB, HCT, PLT, MCV, MCH, MCHC, RDW, LYMPHSABS, MONOABS, EOSABS, BASOSABS  BMET No results found for: NA, K, CL, CO2, GLUCOSE, BUN, CREATININE, CALCIUM, GFRNONAA, GFRAA  BNP No results found for: BNP  ProBNP No results found for: PROBNP  Imaging: Nm Pet Image Initial (pi) Skull Base To Thigh  Result Date: 05/16/2017 CLINICAL DATA:  Initial treatment strategy for right lung mass. EXAM: NUCLEAR MEDICINE PET SKULL BASE TO THIGH TECHNIQUE: 9.6 mCi F-18 FDG was injected intravenously. Full-ring PET imaging was performed from the skull base to thigh after the radiotracer. CT data was obtained and used for attenuation correction and anatomic localization. FASTING BLOOD GLUCOSE:  Value: 174 mg/dl COMPARISON:  CT on 04/12/2017 FINDINGS: NECK:  No hypermetabolic lymph nodes or masses. CHEST: Central right upper lobe mass is seen which measures approximately 6.6 cm and is  hypermetabolic, with SUV max of 15.2. This is contiguous with the right hilum. Postobstructive pneumonitis is seen in the peripheral right upper lobe, which is also hypermetabolic. Small hypermetabolic right paratracheal, precarinal, and subcarinal lymph nodes are seen, largest in the right paratracheal region measuring 10 mm on image 57/4 with SUV max of 4.6. Tiny right pleural effusion shows no significant change or hypermetabolic activity. No suspicious left lung nodules seen on CT images. Stable postop changes from bilateral mastectomies. Mild right axillary lymphadenopathy noted, largest measuring 1.3 cm, with SUV max of 6.9. ABDOMEN/PELVIS: A subtle 2.4 cm low-attenuation lesion is seen in segment 3 of the left lobe, which appears new  since previous study, and has FDG uptake. SUV max measures 8.4. No other hypermetabolic liver lesions identified. Gallbladder is unremarkable. No evidence of biliary ductal dilatation. A 10 mm left adrenal nodule shows focal hypermetabolic activity with SUV max of 4.6. Mild right adrenal thickening again seen without associated hypermetabolic activity. A 2.1 cm hypermetabolic soft tissue density is seen in the upper gastrosplenic ligament on image 94/4. No hypermetabolic lymph nodes identified. There are several tiny sub-cm nodules seen in the left lower abdominal wall subcutaneous fat, with highest SUV max of 3.9. SKELETON: Hypermetabolic soft tissue mass is seen involving the right lateral seventh rib which measures 2.5 x 4.1 cm and has SUV max of 11.2. Other focal areas of hypermetabolism seen within the thoracic and lumbar spine and right iliac crest, consistent with bone metastases. IMPRESSION: Central right upper lobe hypermetabolic mass with involvement of the right hilum, consistent with bronchogenic carcinoma. Postobstructive pneumonitis in the right upper lobe. Small mediastinal lymph node metastases, and possible small right axillary lymph node metastases.  Hypermetabolic bone metastases. Hypermetabolic liver metastasis in the left lobe, and hypermetabolic left adrenal nodule suspicious for adrenal metastasis. Hypermetabolic left upper quadrant soft tissue density in the gastrosplenic ligament, consistent with metastatic disease. Several tiny sub-cm hypermetabolic nodules in left lower abdominal wall subcutaneous fat are nonspecific, however soft tissue metastases cannot be excluded. Electronically Signed   By: Earle Gell M.D.   On: 05/16/2017 14:27     Assessment & Plan:   Lung mass Large right lung mas -PET positive with most c/w metastatic lung cancer  -PET scan that showed a 6.6 cm hypermetabolic right upper lobe mass positive mediastinal lymphadenopathy.  Hypermetabolic bone metastasis liver metastasis and left adrenal nodule.  Hypermetabolic left upper quadrant soft tissue density in the gastro splenic ligament consistent with metastatic disease.  There was several tiny subcentimeter hypermetabolic nodules in the left lower abdominal wall subcutaneous fat that were nonspecific. Will set up for CT guided biopsy of axillary node for staging , if unable to obtain will need FOB/EBUS.  Support provided for pt and information for patient and son .  Once bx results will need to discuss at [facility]/refer to [facility]   Plan  Patient Instructions  Set up for CT Biopsy as discussed.  Once results are back will decide on next step.  Follow up with Dr. Halford Chessman  In 2 months and As needed          Rexene Edison, NP 05/22/2017

## 2017-05-22 NOTE — Assessment & Plan Note (Addendum)
Large right lung mas -PET positive with most c/w metastatic lung cancer  -PET scan that showed a 6.6 cm hypermetabolic right upper lobe mass positive mediastinal lymphadenopathy.  Hypermetabolic bone metastasis liver metastasis and left adrenal nodule.  Hypermetabolic left upper quadrant soft tissue density in the gastro splenic ligament consistent with metastatic disease.  There was several tiny subcentimeter hypermetabolic nodules in the left lower abdominal wall subcutaneous fat that were nonspecific. Will set up for CT guided biopsy of axillary node for staging , if unable to obtain will need FOB/EBUS.  Support provided for pt and information for patient and son .  Once bx results will need to discuss at [facility]/refer to [facility]   Plan  Patient Instructions  Set up for CT Biopsy as discussed.  Once results are back will decide on next step.  Follow up with Dr. Halford Chessman  In 2 months and As needed

## 2017-05-29 ENCOUNTER — Other Ambulatory Visit: Payer: Self-pay | Admitting: Student

## 2017-05-29 ENCOUNTER — Other Ambulatory Visit: Payer: Self-pay | Admitting: General Surgery

## 2017-05-30 ENCOUNTER — Ambulatory Visit (HOSPITAL_COMMUNITY)
Admission: RE | Admit: 2017-05-30 | Discharge: 2017-05-30 | Disposition: A | Discharge status: Home / Self Care | Payer: Medicare PPO | Source: Ambulatory Visit | Attending: General Surgery | Admitting: General Surgery

## 2017-05-30 ENCOUNTER — Encounter (HOSPITAL_COMMUNITY): Payer: Self-pay | Admitting: Emergency Medicine

## 2017-05-30 ENCOUNTER — Encounter (HOSPITAL_COMMUNITY): Payer: Self-pay

## 2017-05-30 ENCOUNTER — Ambulatory Visit (HOSPITAL_COMMUNITY)
Admission: RE | Admit: 2017-05-30 | Discharge: 2017-05-30 | Disposition: A | Discharge status: Home / Self Care | Payer: Medicare PPO | Source: Ambulatory Visit | Attending: Pulmonary Disease | Admitting: Pulmonary Disease

## 2017-05-30 ENCOUNTER — Inpatient Hospital Stay (HOSPITAL_COMMUNITY)
Admission: EM | Admit: 2017-05-30 | Discharge: 2017-06-01 | DRG: 166 | Disposition: A | Discharge status: Home / Self Care | Payer: Medicare PPO | Attending: Internal Medicine | Admitting: Internal Medicine

## 2017-05-30 ENCOUNTER — Other Ambulatory Visit: Payer: Self-pay

## 2017-05-30 DIAGNOSIS — I129 Hypertensive chronic kidney disease with stage 1 through stage 4 chronic kidney disease, or unspecified chronic kidney disease: Secondary | ICD-10-CM | POA: Diagnosis present

## 2017-05-30 DIAGNOSIS — N179 Acute kidney failure, unspecified: Secondary | ICD-10-CM | POA: Diagnosis present

## 2017-05-30 DIAGNOSIS — E119 Type 2 diabetes mellitus without complications: Secondary | ICD-10-CM | POA: Diagnosis not present

## 2017-05-30 DIAGNOSIS — J90 Pleural effusion, not elsewhere classified: Secondary | ICD-10-CM | POA: Diagnosis not present

## 2017-05-30 DIAGNOSIS — J440 Chronic obstructive pulmonary disease with acute lower respiratory infection: Secondary | ICD-10-CM | POA: Diagnosis present

## 2017-05-30 DIAGNOSIS — E1151 Type 2 diabetes mellitus with diabetic peripheral angiopathy without gangrene: Secondary | ICD-10-CM | POA: Diagnosis present

## 2017-05-30 DIAGNOSIS — C3490 Malignant neoplasm of unspecified part of unspecified bronchus or lung: Secondary | ICD-10-CM

## 2017-05-30 DIAGNOSIS — Z9010 Acquired absence of unspecified breast and nipple: Secondary | ICD-10-CM | POA: Diagnosis not present

## 2017-05-30 DIAGNOSIS — Z87891 Personal history of nicotine dependence: Secondary | ICD-10-CM

## 2017-05-30 DIAGNOSIS — J441 Chronic obstructive pulmonary disease with (acute) exacerbation: Secondary | ICD-10-CM | POA: Diagnosis present

## 2017-05-30 DIAGNOSIS — N189 Chronic kidney disease, unspecified: Secondary | ICD-10-CM | POA: Diagnosis present

## 2017-05-30 DIAGNOSIS — J181 Lobar pneumonia, unspecified organism: Secondary | ICD-10-CM | POA: Diagnosis present

## 2017-05-30 DIAGNOSIS — J188 Other pneumonia, unspecified organism: Secondary | ICD-10-CM | POA: Diagnosis not present

## 2017-05-30 DIAGNOSIS — K769 Liver disease, unspecified: Secondary | ICD-10-CM | POA: Diagnosis present

## 2017-05-30 DIAGNOSIS — J189 Pneumonia, unspecified organism: Secondary | ICD-10-CM

## 2017-05-30 DIAGNOSIS — E785 Hyperlipidemia, unspecified: Secondary | ICD-10-CM | POA: Diagnosis present

## 2017-05-30 DIAGNOSIS — R591 Generalized enlarged lymph nodes: Secondary | ICD-10-CM | POA: Diagnosis not present

## 2017-05-30 DIAGNOSIS — R0603 Acute respiratory distress: Secondary | ICD-10-CM | POA: Diagnosis not present

## 2017-05-30 DIAGNOSIS — R0602 Shortness of breath: Secondary | ICD-10-CM

## 2017-05-30 DIAGNOSIS — J9602 Acute respiratory failure with hypercapnia: Secondary | ICD-10-CM | POA: Diagnosis present

## 2017-05-30 DIAGNOSIS — R918 Other nonspecific abnormal finding of lung field: Secondary | ICD-10-CM

## 2017-05-30 DIAGNOSIS — Z853 Personal history of malignant neoplasm of breast: Secondary | ICD-10-CM | POA: Diagnosis not present

## 2017-05-30 DIAGNOSIS — C3411 Malignant neoplasm of upper lobe, right bronchus or lung: Secondary | ICD-10-CM | POA: Diagnosis not present

## 2017-05-30 DIAGNOSIS — E1122 Type 2 diabetes mellitus with diabetic chronic kidney disease: Secondary | ICD-10-CM | POA: Diagnosis present

## 2017-05-30 DIAGNOSIS — N289 Disorder of kidney and ureter, unspecified: Secondary | ICD-10-CM | POA: Diagnosis not present

## 2017-05-30 DIAGNOSIS — Z66 Do not resuscitate: Secondary | ICD-10-CM | POA: Diagnosis present

## 2017-05-30 DIAGNOSIS — I700 Atherosclerosis of aorta: Secondary | ICD-10-CM | POA: Diagnosis not present

## 2017-05-30 DIAGNOSIS — E210 Primary hyperparathyroidism: Secondary | ICD-10-CM | POA: Diagnosis present

## 2017-05-30 DIAGNOSIS — Z79899 Other long term (current) drug therapy: Secondary | ICD-10-CM

## 2017-05-30 DIAGNOSIS — T17908S Unspecified foreign body in respiratory tract, part unspecified causing other injury, sequela: Secondary | ICD-10-CM

## 2017-05-30 DIAGNOSIS — C7951 Secondary malignant neoplasm of bone: Secondary | ICD-10-CM | POA: Diagnosis present

## 2017-05-30 HISTORY — DX: Unspecified chronic bronchitis: J42

## 2017-05-30 LAB — URINALYSIS, ROUTINE W REFLEX MICROSCOPIC
Bilirubin Urine: NEGATIVE
GLUCOSE, UA: NEGATIVE mg/dL
Ketones, ur: NEGATIVE mg/dL
NITRITE: NEGATIVE
PROTEIN: 100 mg/dL — AB
Specific Gravity, Urine: 1.015 (ref 1.005–1.030)
pH: 5 (ref 5.0–8.0)

## 2017-05-30 LAB — CBC
HEMATOCRIT: 33.7 % — AB (ref 36.0–46.0)
HEMOGLOBIN: 10.6 g/dL — AB (ref 12.0–15.0)
MCH: 27.5 pg (ref 26.0–34.0)
MCHC: 31.5 g/dL (ref 30.0–36.0)
MCV: 87.5 fL (ref 78.0–100.0)
Platelets: 544 10*3/uL — ABNORMAL HIGH (ref 150–400)
RBC: 3.85 MIL/uL — AB (ref 3.87–5.11)
RDW: 15.6 % — ABNORMAL HIGH (ref 11.5–15.5)
WBC: 18 10*3/uL — ABNORMAL HIGH (ref 4.0–10.5)

## 2017-05-30 LAB — COMPREHENSIVE METABOLIC PANEL
ALBUMIN: 2.4 g/dL — AB (ref 3.5–5.0)
ALK PHOS: 73 U/L (ref 38–126)
ALT: 25 U/L (ref 14–54)
AST: 13 U/L — ABNORMAL LOW (ref 15–41)
Anion gap: 10 (ref 5–15)
BUN: 57 mg/dL — ABNORMAL HIGH (ref 6–20)
CALCIUM: 9 mg/dL (ref 8.9–10.3)
CO2: 22 mmol/L (ref 22–32)
CREATININE: 2.55 mg/dL — AB (ref 0.44–1.00)
Chloride: 107 mmol/L (ref 101–111)
GFR calc Af Amer: 21 mL/min — ABNORMAL LOW (ref >60)
GFR calc non Af Amer: 18 mL/min — ABNORMAL LOW (ref >60)
GLUCOSE: 152 mg/dL — AB (ref 65–99)
Potassium: 4 mmol/L (ref 3.5–5.1)
SODIUM: 139 mmol/L (ref 135–145)
Total Bilirubin: 0.6 mg/dL (ref 0.3–1.2)
Total Protein: 6.3 g/dL — ABNORMAL LOW (ref 6.5–8.1)

## 2017-05-30 LAB — I-STAT TROPONIN, ED: Troponin i, poc: 0.02 ng/mL (ref 0.00–0.08)

## 2017-05-30 LAB — I-STAT CG4 LACTIC ACID, ED
LACTIC ACID, VENOUS: 1.13 mmol/L (ref 0.5–1.9)
LACTIC ACID, VENOUS: 1.08 mmol/L (ref 0.5–1.9)

## 2017-05-30 LAB — GLUCOSE, CAPILLARY
GLUCOSE-CAPILLARY: 263 mg/dL — AB (ref 65–99)
Glucose-Capillary: 198 mg/dL — ABNORMAL HIGH (ref 65–99)
Glucose-Capillary: 205 mg/dL — ABNORMAL HIGH (ref 65–99)

## 2017-05-30 LAB — PROCALCITONIN: Procalcitonin: 11.79 ng/mL

## 2017-05-30 LAB — PROTIME-INR
INR: 1.28
Prothrombin Time: 15.9 seconds — ABNORMAL HIGH (ref 11.4–15.2)

## 2017-05-30 LAB — BRAIN NATRIURETIC PEPTIDE: B Natriuretic Peptide: 181 pg/mL — ABNORMAL HIGH (ref 0.0–100.0)

## 2017-05-30 LAB — APTT: aPTT: 30 seconds (ref 24–36)

## 2017-05-30 MED ORDER — ENOXAPARIN SODIUM 30 MG/0.3ML ~~LOC~~ SOLN
30.0000 mg | SUBCUTANEOUS | Status: DC
  Administered 2017-05-30: 30 mg via SUBCUTANEOUS
  Filled 2017-05-30: qty 0.3

## 2017-05-30 MED ORDER — IPRATROPIUM-ALBUTEROL 0.5-2.5 (3) MG/3ML IN SOLN
3.0000 mL | Freq: Once | RESPIRATORY_TRACT | Status: AC
  Administered 2017-05-30: 3 mL via RESPIRATORY_TRACT
  Filled 2017-05-30: qty 3

## 2017-05-30 MED ORDER — VANCOMYCIN HCL IN DEXTROSE 1-5 GM/200ML-% IV SOLN
1000.0000 mg | INTRAVENOUS | Status: DC
  Filled 2017-05-30: qty 200

## 2017-05-30 MED ORDER — VANCOMYCIN HCL 10 G IV SOLR
1500.0000 mg | Freq: Once | INTRAVENOUS | Status: AC
  Administered 2017-05-30: 1500 mg via INTRAVENOUS
  Filled 2017-05-30: qty 1500

## 2017-05-30 MED ORDER — LEVOFLOXACIN 500 MG PO TABS
500.0000 mg | ORAL_TABLET | Freq: Once | ORAL | Status: AC
  Administered 2017-05-30: 500 mg via ORAL
  Filled 2017-05-30: qty 1

## 2017-05-30 MED ORDER — PREDNISONE 20 MG PO TABS
40.0000 mg | ORAL_TABLET | Freq: Every day | ORAL | Status: DC
  Administered 2017-05-31 – 2017-06-01 (×2): 40 mg via ORAL
  Filled 2017-05-30 (×2): qty 2

## 2017-05-30 MED ORDER — LIDOCAINE HCL 1 % IJ SOLN
INTRAMUSCULAR | Status: AC
  Filled 2017-05-30: qty 20

## 2017-05-30 MED ORDER — METRONIDAZOLE 500 MG PO TABS
500.0000 mg | ORAL_TABLET | Freq: Two times a day (BID) | ORAL | Status: DC
  Administered 2017-05-30 – 2017-05-31 (×3): 500 mg via ORAL
  Filled 2017-05-30 (×3): qty 1

## 2017-05-30 MED ORDER — SODIUM CHLORIDE 0.9 % IV SOLN: INTRAVENOUS | Status: DC

## 2017-05-30 MED ORDER — PIPERACILLIN-TAZOBACTAM 3.375 G IVPB 30 MIN: 3.3750 g | Freq: Once | INTRAVENOUS | Status: DC

## 2017-05-30 MED ORDER — DEXTROSE 5 % IV SOLN
2.0000 g | Freq: Once | INTRAVENOUS | Status: AC
  Administered 2017-05-30: 2 g via INTRAVENOUS
  Filled 2017-05-30: qty 2

## 2017-05-30 MED ORDER — SODIUM CHLORIDE 0.9 % IV BOLUS (SEPSIS)
500.0000 mL | Freq: Once | INTRAVENOUS | Status: AC
  Administered 2017-05-30: 500 mL via INTRAVENOUS

## 2017-05-30 MED ORDER — DEXTROSE 5 % IV SOLN
2.0000 g | INTRAVENOUS | Status: DC
  Administered 2017-05-31: 2 g via INTRAVENOUS
  Filled 2017-05-30: qty 2

## 2017-05-30 MED ORDER — INSULIN ASPART 100 UNIT/ML ~~LOC~~ SOLN
0.0000 [IU] | Freq: Every day | SUBCUTANEOUS | Status: DC
  Administered 2017-05-30: 2 [IU] via SUBCUTANEOUS

## 2017-05-30 MED ORDER — IPRATROPIUM-ALBUTEROL 0.5-2.5 (3) MG/3ML IN SOLN
3.0000 mL | Freq: Three times a day (TID) | RESPIRATORY_TRACT | Status: DC
  Administered 2017-05-31 – 2017-06-01 (×5): 3 mL via RESPIRATORY_TRACT
  Filled 2017-05-30 (×5): qty 3

## 2017-05-30 MED ORDER — VANCOMYCIN HCL 10 G IV SOLR
1500.0000 mg | Freq: Once | INTRAVENOUS | Status: DC
  Filled 2017-05-30: qty 1500

## 2017-05-30 MED ORDER — IPRATROPIUM-ALBUTEROL 0.5-2.5 (3) MG/3ML IN SOLN
3.0000 mL | Freq: Four times a day (QID) | RESPIRATORY_TRACT | Status: DC
  Administered 2017-05-30 (×2): 3 mL via RESPIRATORY_TRACT
  Filled 2017-05-30 (×2): qty 3

## 2017-05-30 MED ORDER — INSULIN ASPART 100 UNIT/ML ~~LOC~~ SOLN
0.0000 [IU] | Freq: Three times a day (TID) | SUBCUTANEOUS | Status: DC
  Administered 2017-05-30 – 2017-05-31 (×2): 8 [IU] via SUBCUTANEOUS
  Administered 2017-06-01 (×2): 3 [IU] via SUBCUTANEOUS

## 2017-05-30 MED ORDER — PANTOPRAZOLE SODIUM 40 MG PO TBEC
40.0000 mg | DELAYED_RELEASE_TABLET | Freq: Every day | ORAL | Status: DC
  Administered 2017-05-30 – 2017-06-01 (×3): 40 mg via ORAL
  Filled 2017-05-30 (×3): qty 1

## 2017-05-30 MED ORDER — METHYLPREDNISOLONE SODIUM SUCC 125 MG IJ SOLR
125.0000 mg | Freq: Once | INTRAMUSCULAR | Status: AC
  Administered 2017-05-30: 125 mg via INTRAVENOUS
  Filled 2017-05-30: qty 2

## 2017-05-30 NOTE — Discharge Instructions (Addendum)
Needle Biopsy of the Lung, Care After °This sheet gives you information about how to care for yourself after your procedure. Your health care provider may also give you more specific instructions. If you have problems or questions, contact your health care provider. °What can I expect after the procedure? °After the procedure, it is common to have: °· Soreness, pain, and tenderness where a tissue sample was taken (biopsy site). °· A cough. °· A sore throat. ° °Follow these instructions at home: °Biopsy site care °· Follow instructions from your health care provider about when to remove the bandage that was placed on the biopsy site. °· Keep the bandage dry until it has been removed. °· Check your biopsy site every day for signs of infection. Check for: °? More redness, swelling, or pain. °? More fluid or blood. °? Warmth to the touch. °? Pus or a bad smell. °General instructions °· Rest as directed by your health care provider. Ask your health care provider what activities are safe for you. °· Do not take baths, swim, or use a hot tub until your health care provider approves. °· Take over-the-counter and prescription medicines only as told by your health care provider. °· If you have airplane travel scheduled, talk with your health care provider about when it is safe for you to travel by airplane. °· It is up to you to get the results of your procedure. Ask your health care provider, or the department that is doing the procedure, when your results will be ready. °· Keep all follow-up visits as told by your health care provider. This is important. °Contact a health care provider if: °· You have more redness, swelling, or pain around your biopsy site. °· You have more fluid or blood coming from your biopsy site. °· Your biopsy site feels warm to the touch. °· You have pus or a bad smell coming from your biopsy site. °· You have a fever. °· You have pain that does not get better with medicine. °Get help right away  if: °· You have problems breathing. °· You have chest pain. °· You cough up blood. °· You faint. °· You have a fast heart rate. °Summary °· After a needle biopsy of the lung, it is common to have a cough, a sore throat, or soreness, pain, and tenderness where a tissue sample was taken (biopsy site). °· You should check your biopsy area every day for signs of infection, including pus or a bad smell, warmth, more fluid or blood, or more redness, swelling, or pain. °· You should not take baths, swim, or use a hot tub until your health care provider approves. °· It is up to you to get the results of your procedure. Ask your health care provider, or the department that is doing the procedure, when your results will be ready. °This information is not intended to replace advice given to you by your health care provider. Make sure you discuss any questions you have with your health care provider. °Document Released: 05/07/2007 Document Revised: 05/31/2016 Document Reviewed: 05/31/2016 °Elsevier Interactive Patient Education © 2017 [employer] ° °Moderate Conscious Sedation, Adult, Care After °These instructions provide you with information about caring for yourself after your procedure. Your health care provider may also give you more specific instructions. Your treatment has been planned according to current medical practices, but problems sometimes occur. Call your health care provider if you have any problems or questions after your procedure. °What can I expect after the   procedure? °After your procedure, it is common: °· To feel sleepy for several hours. °· To feel clumsy and have poor balance for several hours. °· To have poor judgment for several hours. °· To vomit if you eat too soon. ° °Follow these instructions at home: °For at least 24 hours after the procedure: ° °· Do not: °? Participate in activities where you could fall or become injured. °? Drive. °? Use heavy machinery. °? Drink alcohol. °? Take sleeping  pills or medicines that cause drowsiness. °? Make important decisions or sign legal documents. °? Take care of children on your own. °· Rest. °Eating and drinking °· Follow the diet recommended by your health care provider. °· If you vomit: °? Drink water, juice, or soup when you can drink without vomiting. °? Make sure you have little or no nausea before eating solid foods. °General instructions °· Have a responsible adult stay with you until you are awake and alert. °· Take over-the-counter and prescription medicines only as told by your health care provider. °· If you smoke, do not smoke without supervision. °· Keep all follow-up visits as told by your health care provider. This is important. °Contact a health care provider if: °· You keep feeling nauseous or you keep vomiting. °· You feel light-headed. °· You develop a rash. °· You have a fever. °Get help right away if: °· You have trouble breathing. °This information is not intended to replace advice given to you by your health care provider. Make sure you discuss any questions you have with your health care provider. °Document Released: 04/30/2013 Document Revised: 12/13/2015 Document Reviewed: 10/30/2015 °Elsevier Interactive Patient Education © 2018 [employer] ° °

## 2017-05-30 NOTE — Consult Note (Signed)
Name: Erin Edwards MRN: [id number] DOB: 1946-01-23    ADMISSION DATE:  05/30/2017 CONSULTATION DATE: 11/7  REFERRING MD :  helberg  CHIEF COMPLAINT: Lung mass,  BRIEF PATIENT DESCRIPTION:  71 year old female patient who has been followed in the pulmonary clinic for newly found right hilar lung mass.  Initially diagnosed 9/20.  PET scan obtained 10/24 demonstrating 6.6 cm mass in the right upper lobe, and hypermetabolic activity involving the mediastinum, right axillary lymph node, left lobe of the liver, left adrenal gland, and right seventh rib.  She was to undergo CT-guided biopsy of the right seventh rib on 11/7.When evaluated by the interventional radiology staff she was found to be very tachypneic, tachycardic, chest x-ray showed worsening of right upper lobe airspace disease.  A CBC was obtained demonstrating leukocytosis.  Because of this it was felt unsafe to proceed with diagnostic procedure, she was brought to the emergency room for further evaluation.  Pulmonary asked to see for further recommendations in regards to cancer diagnostics as well as supportive therapy. SIGNIFICANT EVENTS   STUDIES:  CT chest 04/12/17 >> CAD, Rt hilar mass with consolidation, small Rt effusion, hilar LAN, Rt 7th rib met PET scan 05/16/17 >> 6.6 cm mass RUL 15.2 SUV, mediastinal LAN up to 10 mm SUV 4.6, Rt axillary LAN 1.3 cm SUV 6.9, 2.4 cm Lt lobe of liver SUV 8.4, 10 mm Lt adrenal SUV 4.6, Rt 7th rib SUV 11.2   HISTORY OF PRESENT ILLNESS:   71 year old female who is been followed by Dr. said since late September for right upper lobe lung mass.  Most recent PET scan obtained on 10/24 demonstrates hypermetabolic activity involving the mediastinum, the right axillary lymph node, left lobe of the liver, left adrenal gland, and 7 right rib.  As noted above she was at interventional radiology on 11/7 for planned CT-guided biopsy.  This was initially planned on being an lymph node biopsy, however after  radiology review it was decided to attempt rib biopsy.  On presentation found to be significantly short of breath so sent to the emergency room. On further questioning the patient reports ongoing chronic dyspnea really since July 2018, however abruptly worse over the last 7-10 days.  Her son who is at bedside verifies this.  She has had no recent sick exposure, no fever, no chills, no sinus congestion no sore throat.  She has had chronic dyspnea which is gotten worse over the last 7 days, this is been associated with increasing audible upper airway wheeze notable by standing in the room.  She has had a constant cough, but notes over the last week she is been awoke at night with a cough productive of what she thinks might be specks of blood.  She has chest pain associated with her cough, but not associated with activity.  Her activity tolerance has remarkably decreased so much so that she is actually fallen at home due to inability to tolerate much activity.  She notes that she is gotten much weaker.  She denies heart palpitations, dizziness, loss of consciousness.  She had no diarrhea, no constipation, however her weight has dropped almost 20 pounds since July, and she has had significant decrease in her appetite.  Chest x-ray evaluation in the emergency room does demonstrate known right upper lobe consolidation.  There is also marked increase airspace disease surrounding this area in comparison to prior film.  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  has a past surgical history that includes Appendectomy; Abdominal hysterectomy; Eye surgery; and Rotator cuff repair. Prior to Admission medications   Medication Sig Start Date End Date Taking? Authorizing Provider  amlodipine-atorvastatin (CADUET) 10-10 MG tablet     [provider]  atorvastatin (LIPITOR) 10 MG tablet     [provider]  buPROPion  (WELLBUTRIN) 100 MG tablet     [provider]  Dulaglutide (TRULICITY) 2.35 TD/3.2KG SOPN     [provider]  fluticasone furoate-vilanterol (BREO ELLIPTA) 100-25 MCG/INH AEPB     [provider]  gabapentin (NEURONTIN) 300 MG capsule     [provider]  lisinopril (PRINIVIL,ZESTRIL) 40 MG tablet     [provider]  rosuvastatin (CRESTOR) 10 MG tablet     [provider]  sertraline (ZOLOFT) 100 MG tablet     [provider]   Allergies  Allergen Reactions  . Codeine   . Penicillins   . Sulfa Antibiotics     FAMILY HISTORY:  family history includes Cancer in her mother; Diabetes in her father; Heart disease in her father. SOCIAL HISTORY:  reports that she quit smoking about 3 years ago. Her smoking use included cigarettes. She has a 75.00 pack-year smoking history. she has never used smokeless tobacco. She reports that she does not drink alcohol or use drugs.  REVIEW OF SYSTEMS:   Constitutional: Negative for fever, chills,weight loss, malaise/fatigue and diaphoresis.  HENT: Negative for hearing loss, ear pain, nosebleeds, congestion, sore throat, neck pain, tinnitus and ear discharge.   Eyes: Negative for blurred vision, double vision, photophobia, pain, discharge and redness.  Respiratory: Negative for cough,possible hemoptysis, sputum production, shortness of breath, wheezing and stridor.   Cardiovascular: Negative for chest pain, palpitations, orthopnea, claudication, leg swelling and PND.  Gastrointestinal: Negative for heartburn, nausea, vomiting, abdominal pain, diarrhea, constipation, blood in stool and melena. + bloating and GERD Genitourinary: Negative for dysuria, urgency, frequency, hematuria and flank pain.  Musculoskeletal: Negative for myalgias, back pain, joint pain and falls. Weakness Skin: Negative for itching and rash.  Neurological: Negative for dizziness, tingling, tremors, sensory change, speech  change, focal weakness, seizures, loss of consciousness, weakness and headaches.  Endo/Heme/Allergies: Negative for environmental allergies and polydipsia. Does not bruise/bleed easily.  SUBJECTIVE:  Short of breath, feels better sitting up straight VITAL SIGNS: Temp:  [97.7 F (36.5 C)-98.3 F (36.8 C)] 98.2 F (36.8 C) (11/07 0930) Pulse Rate:  [103-115] 106 (11/07 1345) Resp:  [20-33] 31 (11/07 1345) BP: (134-183)/(61-96) 152/72 (11/07 1245) SpO2:  [95 %-100 %] 99 % (11/07 1427) Weight:  [185 lb (83.9 kg)] 185 lb (83.9 kg) (11/07 0926)  PHYSICAL EXAMINATION: General: 71 year old Caucasian female sitting up in bed.  Tachypneic, some accessory muscle use, has difficulty completing full sentences Neuro: Awake, oriented, no focal deficits HEENT: Normocephalic atraumatic her mucous membranes are moist, there is no jugular venous distention Cardiovascular: Tach regular regular no murmur rub or gallop Lungs: Tachypneic, mild accessory muscle use, remarkable upper airway wheeze localized to the neck.  Some distant crackles on the right posterior Abdomen: Soft nontender no organomegaly no pain Musculoskeletal: Equal strength and bulk Skin: Warm dry intact brisk cap refill  Recent Labs  Lab 05/30/17 1030  NA 139  K 4.0  CL 107  CO2 22  BUN 57*  CREATININE 2.55*  GLUCOSE 152*   Recent Labs  Lab 05/30/17 0600  HGB 10.6*  HCT 33.7*  WBC 18.0*  PLT 544*  Dg Chest Port 1 View  Result Date: 05/30/2017 CLINICAL DATA:  Leukocytosis.  Dyspnea. EXAM: PORTABLE CHEST 1 VIEW COMPARISON:  03/24/2017 FINDINGS: The large central right upper lobe mass is larger. Opacity extending to the lateral right chest wall is associated. Heterogeneous opacities surrounding the mass extending towards the right apex have increased worrisome for postobstructive pneumonitis. A destructive lesion of a right lateral mid rib is worse with enlargement of the associated soft tissue mass. No Kerley B lines to  suggest volume overload. Vascularity is within normal limits. Heart is normal in size. No pneumothorax. No pleural effusion. IMPRESSION: Large right upper lobe mass is worse. Associated postobstructive pneumonitis is worse. No evidence of CHF. Electronically Signed   By: Marybelle Killings M.D.   On: 05/30/2017 08:58    ASSESSMENT / PLAN:   Right upper lobe lung mass with what appears to be widely metastatic disease involving the mediastinum, liver, left adrenal gland, and right seventh rib.  Now complicated by what appears to be postobstructive pneumonia  Discussion Acutely more short of breath than baseline, however has had baseline exertional dyspnea.  In the setting of tachycardia, leukocytosis, and tachypnea as well as debilitated state agree  with interventional radiology that conscious sedation would be dangerous in the patient's current state.  Plan/rec Admit for IV antibiotics Gentle IV hydration Add scheduled bronchodilators Add PPI, unclear how much of upper airway wheezing is reflux related versus could this reflect airway obstruction from tumor Pulse oximetry Wean oxygen as needed Procalcitonin algorithm Can reevaluate diagnostic options in regards to tissue sampling when she is more stable.  Ideally biopsy of either the rib, liver, adrenal gland, or lymph node would be best instead of bronchoscopy as would not require as heavy sedation and would give better staging information.  05/30/2017, 2:28 PM

## 2017-05-30 NOTE — Progress Notes (Addendum)
Pharmacy Antibiotic Note  Erin Edwards is a 71 y.o. female admitted on 05/30/2017 with post obstructive pneumonia.  Pharmacy has been consulted for cefepime and vancomycin dosing. Patient has received a dose of Cefepime 2gm IV in the ED. She has also been started on IV metronidazole for anaerobic coverage. WBC elevated at 18. SCr 2.55. nCrCl ~ 20 mL/min   Plan: -Cefepime 2 gm IV Q 24 hours -Vancomycin 1500 mg IV once, then vancomycin 1 gm IV Q 24 hours -Metronidazole 500 mg IV Q 12 hours per MD  -Monitor CBC, renal fx, cultures and clinical progree -VT at SS   Weight: 185 lb (83.9 kg)  Temp (24hrs), Avg:98.1 F (36.7 C), Min:97.7 F (36.5 C), Max:98.3 F (36.8 C)  Recent Labs  Lab 05/30/17 0600 05/30/17 1030 05/30/17 1046 05/30/17 1234  WBC 18.0*  --   --   --   CREATININE  --  2.55*  --   --   LATICACIDVEN  --   --  1.13 1.08    Estimated Creatinine Clearance: 20.5 mL/min (A) (by C-G formula based on SCr of 2.55 mg/dL (H)).    Allergies  Allergen Reactions  . Codeine   . Penicillins   . Sulfa Antibiotics     Antimicrobials this admission: Vanc 11/7 >>  Cefepime 11/7 >>   Dose adjustments this admission: None   Microbiology results: 11/7 BCx:  11/7 UCx:    Thank you for allowing pharmacy to be a part of this patient's care.  Albertina Parr, PharmD., BCPS Clinical Pharmacist Pager [phone]

## 2017-05-30 NOTE — ED Triage Notes (Addendum)
Pt reports from radiology where she was due to have a lung or rib biopsy done, pt has hx of COPD and lung Cancer. Pt was told she was short of breath, had a chest xray and was told she has pneumonia and needs to be admitted. Did not have procedure completed, and came to ED. 20G PIV in right hand. Pt has CBC and PT/INR PTT resulted form today but no BMP or CMP. Audible wheezing noted.

## 2017-05-30 NOTE — ED Notes (Addendum)
Pt c/o pain in the right arm a few inches above the IV site.  This RN assessed the site and no redness, swelling or signs of infiltration noted.  Pt states pain has subsided.  Will continue to monitor.

## 2017-05-30 NOTE — ED Notes (Signed)
Attempted report x1. 

## 2017-05-30 NOTE — H&P (Signed)
Date: 05/30/2017               Patient Name:  Erin Edwards MRN: [id number]  DOB: 1945/11/19 Age / Sex: 71 y.o., female   PCP: Monico Blitz, MD         Medical Service: Internal Medicine Teaching Service         Attending Physician: Dr. Bartholomew Crews, MD    First Contact: Dr. Tarri Abernethy Pager: [phone]  Second Contact: Dr. Philipp Ovens Pager: [phone]       After Hours (After 5p/  First Contact Pager: [phone]  weekends / holidays): Second Contact Pager: [phone]   Chief Complaint: Shortness of breath   History of Present Illness: Erin Edwards is a 71 y.o. Female with a PMHx significant for HTN, DM, COPD, tobacco abuse, and bilateral breast cancer s/p mastectomy who presented to the ED from interventional radiology with worsening shortness of breath.   The patient states that this all started in June when she presented to her PCP with worsening dyspnea. CXR illustrated findings consistent with pneumonia and she was prescribed antibiotics with steroids. She returned to her PCP 3 weeks later and repeat CXR again illustrated a right lobe infiltrate. She was treated with another round of antibiotics; however CXR again illustrate failure to resolve. CT chest was pursued that showed a right hilar mass with consolidation, a small right pleural effusion, hilar LAD, and right 7th rib metastasis. She was sent for evaluation by a pulmonologist. Her findings were consistent with primary lung cancer and PET scan was done. PET scan illustrated right upper lobe mass consistent with bronchogenic carcinoma, mediastinal lymph nodes, right axillary lymph nodes, bone metastases, liver lesions, left adrenal lesion, and lesion on the gastrosplenic ligament. She was then referred for interventional radiology for biopsy but found to have worsening dyspnea and was sent to the ED for further evaluation. Repeat chest x-ray showed worsening right upper lobe mass with associated postobstructive pneumonitis. For the last  4-5 days she has noticed worsening dyspnea and cough. She does report hemoptysis. Denies fevers, chills, constipation, abdominal pain, chest pain.   In the ED she found to be hemodynamically stable and she was started on Levaquin and Cefepime. Pertinent labs include creatinine 2.55, Ca 10.6, and leukocytosis. Urine analysis was significant for hematuria, pyuria, yeast, and calcium oxalate crystals.   Outpatient Medications:  She reports stopping all her outpatient medications approximately 4-6 months ago   Allergies: Allergies as of 05/30/2017 - Review Complete 05/30/2017  Allergen Reaction Noted  . Codeine  01/05/2011  . Penicillins  01/05/2011  . Sulfa antibiotics  01/05/2011   Past Medical History:  Diagnosis Date  . Claudication ([place])   . Diabetes mellitus   . Hyperlipidemia   . Hypertension    Family History: Mother: + Breast cancer (53s)  Social History:  Former smoker, quit 3 years ago. 75 pack year history  Denies EtOH or drug use  Review of Systems: A complete ROS was negative except as per HPI.   Physical Exam: Blood pressure (!) 152/72, pulse (!) 106, temperature 98.2 F (36.8 C), temperature source Oral, resp. rate (!) 31, weight 185 lb (83.9 kg), SpO2 98 %.  General: Obese female with increased work of breathing while on 2L/min   HENT: Normocephalic, atraumatic, moist mucus membranes  Pulm: Increased work of breathing, diffuse wheezing and crackles  CV: RRR, no murmurs, no rubs, no JVD Abdomen: Active bowel sounds, soft, non-distended, no tenderness to palpation  Extremities: Trace  pitting LE edema, pulses palpable in all extremities  Skin: No new skin lesions or rashes Neuro: Alert and oriented   EKG: personally reviewed: My interpretation is sinus arrhythmia, normal axis, normal intervals, no ST elevation or T wave inversion   CXR: personally reviewed: My interpretation is right upper lobe consolidation   Assessment & Plan by Problem: Active  Problems:   Postobstructive pneumonia  1. Postobstructive Pneumonia: Erin Edwards is a 71 y.o. Female who has signs and symptoms of primary lung cancer who presented to the ED with worsening symptoms. He newly developed hemoptysis, leukocytosis, and worsening SOB are consistent with pneumonia. Her structural lung disease, frequent antibiotic use, and imaging consistent with primary lung cancer place her at elevated risk for multidrug resistant organisms. She was initially treated with Levaquin and Cefepime. We will broaden antibiotic coverage to include pseudomonas and anaerobic organisms. We will consult pulmonology to evaluate doing a endobronchial biopsy vs CT guided biopsy.   - Broaden antibiotic coverage to Vancomycin, Metronidazole, and Cefepime  - Consulted Pulmonology  - Oxygen therapy to maintain oxygen saturation near 92% - Scheduled breathing treatments  - Start Prednisone 40 mg tomorrow   2. Right Upper Lobe Mass  - Follows with Dr. Halford Chessman - CT chest was pursued that showed a right hilar mass with consolidation, a small right pleural effusion, hilar LAD, and right 7th rib metastasis. - PET scan illustrated right upper lobe mass consistent with bronchogenic carcinoma, mediastinal lymph nodes, right axillary lymph nodes, bone metastases, liver lesions, left adrenal lesion, and lesion on the gastrosplenic ligament. - Consulted pulmonology for endobronchial biopsy vs Ct guided biopsy   3. COPD - Self reports history of COPD  - Oxygen therapy to maintain oxygen saturation near 92% - Scheduled breathing treatments  - Start Prednisone 40 mg tomorrow   4. Renal Dysfunction  - Unclear baseline  - Creatinine 2.55  - Urine analysis was significant for hematuria, pyuria, yeast, and calcium oxalate crystals. - Will treat if symptomatic with one time dose of fluconazole   5. Hypertension  - Continue to monitor  - Previously was on Amlodipine 10 mg and Lisinopril 40 mg  - Will restart  amlodipine is hypertensive   6. Diabetes Mellitus  - Moderate SSI TID with meals and night time coverage   Diet: Carb Modified  VTE ppx: Lovenox (renally adjusted)  Code Status: DNR  Dispo: Admit patient to Inpatient with expected length of stay greater than 2 midnights.  Signed: Ina Homes, MD 05/30/2017, 2:00 PM  My Pager: [phone]

## 2017-05-30 NOTE — ED Provider Notes (Signed)
[facility] DEPART[facility] Provider Note   CSN: [id number] Arrival date & time: 05/30/17  0907     History   Chief Complaint Chief Complaint  Patient presents with  . Shortness of Breath  . Pneumonia    HPI Erin Edwards is a 71 y.o. female.  The history is provided by the patient and a relative. No language interpreter was used.  Shortness of Breath   Pneumonia  Associated symptoms include shortness of breath.   Erin Edwards is a 71 y.o. female who presents to the Emergency Department complaining of sob.  She presents from interventional radiology for evaluation of pneumonia.  She has a history of chronic shortness of breath and is currently getting evaluated for a right lung mass.  She was scheduled to have a biopsy today when they referred her to the emergency department due to shortness of breath and pneumonia on her chest x-ray.  She reports progressive shortness of breath and dyspnea on exertion with cough productive of mucoid sputum with occasional purple specks.  Symptoms have been gradually progressing over the last week or 2.  She denies any chest pain, fevers.  She does have mild lower extremity swelling.  She is currently not on oxygen at home.  She reports the oxygen in the emergency department is improving her symptoms. Past Medical History:  Diagnosis Date  . Claudication ([place])   . Diabetes mellitus   . Hyperlipidemia   . Hypertension     Patient Active Problem List   Diagnosis Date Noted  . Postobstructive pneumonia 05/30/2017  . Lung mass 05/22/2017  . Numbness 04/26/2016  . 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 ABDOMINAL HYSTERECTOMY    . APPENDECTOMY    . EYE SURGERY     cataract removal  . ROTATOR CUFF REPAIR      OB History    No data available       Home Medications    Prior to Admission medications   Medication Sig Start Date End Date Taking? Authorizing  Provider  amlodipine-atorvastatin (CADUET) 10-10 MG tablet     [provider]  atorvastatin (LIPITOR) 10 MG tablet     [provider]  buPROPion (WELLBUTRIN) 100 MG tablet     [provider]  Dulaglutide (TRULICITY) 6.94 WN/4.6EV SOPN     [provider]  fluticasone furoate-vilanterol (BREO ELLIPTA) 100-25 MCG/INH AEPB     [provider]  gabapentin (NEURONTIN) 300 MG capsule     [provider]  lisinopril (PRINIVIL,ZESTRIL) 40 MG tablet     [provider]  rosuvastatin (CRESTOR) 10 MG tablet     [provider]  sertraline (ZOLOFT) 100 MG tablet     [provider]    Family History Family History  Problem Relation Age of Onset  . Cancer Mother        breast cancer  . Heart disease Father   . Diabetes Father     Social History Social History   Tobacco Use  . Smoking status: Former Smoker    Packs/day: 1.50    Years: 50.00    Pack years: 75.00    Types: Cigarettes    Last attempt to quit: 2015    Years since quitting: 3.8  . Smokeless tobacco: Never Used  Substance Use Topics  . Alcohol use: No  . Drug use: No     Allergies   Codeine;  Penicillins; and Sulfa antibiotics   Review of Systems Review of Systems  Respiratory: Positive for shortness of breath.   All other systems reviewed and are negative.    Physical Exam Updated Vital Signs BP (!) 163/73   Pulse (!) 125   Temp 98.2 F (36.8 C) (Oral)   Resp (!) 30   Wt 83.9 kg (185 lb)   SpO2 96%   BMI 33.30 kg/m   Physical Exam  Constitutional: She is oriented to person, place, and time. She appears well-developed and well-nourished.  HENT:  Head: Normocephalic and atraumatic.  Cardiovascular: Regular rhythm.  No murmur heard. Tachycardic  Pulmonary/Chest: Breath sounds normal. She is in respiratory distress.  Tachypnea with diffuse rhonchi and wheezes, right greater than left  Abdominal: Soft. There is no  tenderness. There is no rebound and no guarding.  Musculoskeletal:  Trace nonpitting edema to bilateral lower extremities  Neurological: She is alert and oriented to person, place, and time.  Skin: Skin is warm and dry.  Psychiatric: She has a normal mood and affect. Her behavior is normal.  Nursing note and vitals reviewed.    ED Treatments / Results  Labs (all labs ordered are listed, but only abnormal results are displayed) Labs Reviewed  COMPREHENSIVE METABOLIC PANEL - Abnormal; Notable for the following components:      Result Value   Glucose, Bld 152 (*)    BUN 57 (*)    Creatinine, Ser 2.55 (*)    Total Protein 6.3 (*)    Albumin 2.4 (*)    AST 13 (*)    GFR calc non Af Amer 18 (*)    GFR calc Af Amer 21 (*)    All other components within normal limits  BRAIN NATRIURETIC PEPTIDE - Abnormal; Notable for the following components:   B Natriuretic Peptide 181.0 (*)    All other components within normal limits  URINALYSIS, ROUTINE W REFLEX MICROSCOPIC - Abnormal; Notable for the following components:   APPearance CLOUDY (*)    Hgb urine dipstick LARGE (*)    Protein, ur 100 (*)    Leukocytes, UA SMALL (*)    Bacteria, UA RARE (*)    Squamous Epithelial / LPF 0-5 (*)    All other components within normal limits  CULTURE, BLOOD (ROUTINE X 2)  CULTURE, BLOOD (ROUTINE X 2)  URINE CULTURE  PROCALCITONIN  I-STAT TROPONIN, ED  I-STAT CG4 LACTIC ACID, ED  I-STAT CG4 LACTIC ACID, ED    EKG  EKG Interpretation  Date/Time:  Wednesday May 30 2017 09:27:39 EST Ventricular Rate:  113 PR Interval:    QRS Duration: 85 QT Interval:  367 QTC Calculation: 504 R Axis:   51 Text Interpretation:  Sinus tachycardia with irregular rate Abnormal R-wave progression, early transition Borderline T wave abnormalities Prolonged QT interval Confirmed by Quintella Reichert [phone]) on 05/30/2017 10:08:18 AM       Radiology Dg Chest Port 1 View  Result Date: 05/30/2017 CLINICAL DATA:   Leukocytosis.  Dyspnea. EXAM: PORTABLE CHEST 1 VIEW COMPARISON:  03/24/2017 FINDINGS: The large central right upper lobe mass is larger. Opacity extending to the lateral right chest wall is associated. Heterogeneous opacities surrounding the mass extending towards the right apex have increased worrisome for postobstructive pneumonitis. A destructive lesion of a right lateral mid rib is worse with enlargement of the associated soft tissue mass. No Kerley B lines to suggest volume overload. Vascularity is within normal limits. Heart is normal in size. No pneumothorax. No pleural  effusion. IMPRESSION: Large right upper lobe mass is worse. Associated postobstructive pneumonitis is worse. No evidence of CHF. Electronically Signed   By: Marybelle Killings M.D.   On: 05/30/2017 08:58    Procedures Procedures (including critical care time)  Medications Ordered in ED Medications  insulin aspart (novoLOG) injection 0-15 Units (not administered)  insulin aspart (novoLOG) injection 0-5 Units (not administered)  metroNIDAZOLE (FLAGYL) tablet 500 mg (not administered)  vancomycin (VANCOCIN) 1,500 mg in sodium chloride 0.9 % 500 mL IVPB (1,500 mg Intravenous New Bag/Given 05/30/17 1513)  ipratropium-albuterol (DUONEB) 0.5-2.5 (3) MG/3ML nebulizer solution 3 mL (3 mLs Nebulization Given 05/30/17 1426)  predniSONE (DELTASONE) tablet 40 mg (not administered)  vancomycin (VANCOCIN) IVPB 1000 mg/200 mL premix (not administered)  ceFEPIme (MAXIPIME) 2 g in dextrose 5 % 50 mL IVPB (not administered)  pantoprazole (PROTONIX) EC tablet 40 mg (not administered)  ceFEPIme (MAXIPIME) 2 g in dextrose 5 % 50 mL IVPB (0 g Intravenous Stopped 05/30/17 1246)  methylPREDNISolone sodium succinate (SOLU-MEDROL) 125 mg/2 mL injection 125 mg (125 mg Intravenous Given 05/30/17 1208)  ipratropium-albuterol (DUONEB) 0.5-2.5 (3) MG/3ML nebulizer solution 3 mL (3 mLs Nebulization Given 05/30/17 0959)  sodium chloride 0.9 % bolus 500 mL (0 mLs  Intravenous Stopped 05/30/17 1255)  levofloxacin (LEVAQUIN) tablet 500 mg (500 mg Oral Given 05/30/17 1315)     Initial Impression / Assessment and Plan / ED Course  I have reviewed the triage vital signs and the nursing notes.  Pertinent labs & imaging results that were available during my care of the patient were reviewed by me and considered in my medical decision making (see chart for details).     Patient with recent diagnosis of lung mass here for evaluation of worsening shortness of breath and increased work of breathing from reviewed labs that were drawn prior to ED arrival.  CBC with leukocytosis.  Providing treatment for pneumonia as well as reactive airway disease with steroids, Solu-Medrol, antibiotics.  BMP demonstrates renal insufficiency, unclear if this is acute or chronic.  Providing gentle fluid hydration.  Medicine consulted for admission for further treatment of postobstructive pneumonia.  Final Clinical Impressions(s) / ED Diagnoses   Final diagnoses:  Post-obstructive pneumonia due to foreign body aspiration    ED Discharge Orders    None       Quintella Reichert, MD 05/30/17 1552

## 2017-05-30 NOTE — Progress Notes (Signed)
Patient ID: Erin Edwards, female   DOB: 02/05/1946, 71 y.o.   MRN: [id number] Patient presented today for right lytic rib lesion biopsy however, she is having difficulty breathing and unable to complete a sentence without having to pause.  She is tachycardic and has an elevated WBC.  She states she does not feel well, but hasn't in months.  A PCXR was ordered which reveals worsening RUL mass with associated postobstructive pneumonitis.  Dr. Barbie Banner has discussed this patient with Dr. Halford Chessman.  Given her respiratory states, it is felt unsafe to proceed with an elective biopsy under sedation.  It has been requested she be taken to the ED for evaluation for further management.  PE: Heart: tachy Lungs: diffuse rhonchi with audible expiratory wheezing   [name] E 9:40 AM 05/30/2017

## 2017-05-31 ENCOUNTER — Inpatient Hospital Stay (HOSPITAL_COMMUNITY): Payer: Medicare PPO

## 2017-05-31 ENCOUNTER — Encounter (HOSPITAL_COMMUNITY): Payer: Self-pay | Admitting: Physician Assistant

## 2017-05-31 DIAGNOSIS — R0603 Acute respiratory distress: Secondary | ICD-10-CM

## 2017-05-31 DIAGNOSIS — N289 Disorder of kidney and ureter, unspecified: Secondary | ICD-10-CM

## 2017-05-31 DIAGNOSIS — I700 Atherosclerosis of aorta: Secondary | ICD-10-CM

## 2017-05-31 DIAGNOSIS — C7951 Secondary malignant neoplasm of bone: Secondary | ICD-10-CM

## 2017-05-31 DIAGNOSIS — J90 Pleural effusion, not elsewhere classified: Secondary | ICD-10-CM

## 2017-05-31 DIAGNOSIS — C3411 Malignant neoplasm of upper lobe, right bronchus or lung: Secondary | ICD-10-CM

## 2017-05-31 DIAGNOSIS — J441 Chronic obstructive pulmonary disease with (acute) exacerbation: Secondary | ICD-10-CM

## 2017-05-31 DIAGNOSIS — R918 Other nonspecific abnormal finding of lung field: Secondary | ICD-10-CM

## 2017-05-31 DIAGNOSIS — E119 Type 2 diabetes mellitus without complications: Secondary | ICD-10-CM

## 2017-05-31 DIAGNOSIS — E669 Obesity, unspecified: Secondary | ICD-10-CM

## 2017-05-31 DIAGNOSIS — J188 Other pneumonia, unspecified organism: Secondary | ICD-10-CM

## 2017-05-31 DIAGNOSIS — I10 Essential (primary) hypertension: Secondary | ICD-10-CM

## 2017-05-31 DIAGNOSIS — R591 Generalized enlarged lymph nodes: Secondary | ICD-10-CM

## 2017-05-31 LAB — CBC
HCT: 30.6 % — ABNORMAL LOW (ref 36.0–46.0)
HEMOGLOBIN: 9.3 g/dL — AB (ref 12.0–15.0)
MCH: 27.2 pg (ref 26.0–34.0)
MCHC: 30.4 g/dL (ref 30.0–36.0)
MCV: 89.5 fL (ref 78.0–100.0)
Platelets: 447 10*3/uL — ABNORMAL HIGH (ref 150–400)
RBC: 3.42 MIL/uL — ABNORMAL LOW (ref 3.87–5.11)
RDW: 15.7 % — ABNORMAL HIGH (ref 11.5–15.5)
WBC: 18.4 10*3/uL — ABNORMAL HIGH (ref 4.0–10.5)

## 2017-05-31 LAB — BASIC METABOLIC PANEL
Anion gap: 11 (ref 5–15)
BUN: 59 mg/dL — AB (ref 6–20)
CALCIUM: 8.7 mg/dL — AB (ref 8.9–10.3)
CO2: 19 mmol/L — AB (ref 22–32)
Chloride: 109 mmol/L (ref 101–111)
Creatinine, Ser: 2.96 mg/dL — ABNORMAL HIGH (ref 0.44–1.00)
GFR calc Af Amer: 17 mL/min — ABNORMAL LOW (ref >60)
GFR calc non Af Amer: 15 mL/min — ABNORMAL LOW (ref >60)
GLUCOSE: 189 mg/dL — AB (ref 65–99)
Potassium: 4.8 mmol/L (ref 3.5–5.1)
Sodium: 139 mmol/L (ref 135–145)

## 2017-05-31 LAB — SODIUM, URINE, RANDOM: Sodium, Ur: 75 mmol/L

## 2017-05-31 LAB — URINALYSIS, ROUTINE W REFLEX MICROSCOPIC
Bilirubin Urine: NEGATIVE
Glucose, UA: 50 mg/dL — AB
Ketones, ur: NEGATIVE mg/dL
LEUKOCYTES UA: NEGATIVE
Nitrite: NEGATIVE
PH: 5 (ref 5.0–8.0)
Protein, ur: 100 mg/dL — AB
SPECIFIC GRAVITY, URINE: 1.013 (ref 1.005–1.030)

## 2017-05-31 LAB — STREP PNEUMONIAE URINARY ANTIGEN: Strep Pneumo Urinary Antigen: NEGATIVE

## 2017-05-31 LAB — GLUCOSE, CAPILLARY
Glucose-Capillary: 196 mg/dL — ABNORMAL HIGH (ref 65–99)
Glucose-Capillary: 177 mg/dL — ABNORMAL HIGH (ref 65–99)

## 2017-05-31 LAB — CREATININE, URINE, RANDOM: CREATININE, URINE: 62.42 mg/dL

## 2017-05-31 MED ORDER — DEXTROSE 5 % IV SOLN
1.0000 g | INTRAVENOUS | Status: DC
  Administered 2017-06-01: 1 g via INTRAVENOUS
  Filled 2017-05-31: qty 1

## 2017-05-31 MED ORDER — VANCOMYCIN HCL IN DEXTROSE 1-5 GM/200ML-% IV SOLN
1000.0000 mg | INTRAVENOUS | Status: DC
  Administered 2017-06-01: 1000 mg via INTRAVENOUS
  Filled 2017-05-31: qty 200

## 2017-05-31 MED ORDER — SODIUM CHLORIDE 0.9 % IV SOLN
INTRAVENOUS | Status: DC
  Administered 2017-05-31 – 2017-06-01 (×3): via INTRAVENOUS

## 2017-05-31 MED ORDER — ENOXAPARIN SODIUM 30 MG/0.3ML ~~LOC~~ SOLN: 30.0000 mg | SUBCUTANEOUS | Status: DC

## 2017-05-31 MED ORDER — ALBUTEROL SULFATE (2.5 MG/3ML) 0.083% IN NEBU
2.5000 mg | INHALATION_SOLUTION | RESPIRATORY_TRACT | Status: DC | PRN
  Administered 2017-05-31 – 2017-06-01 (×2): 2.5 mg via RESPIRATORY_TRACT
  Filled 2017-05-31 (×2): qty 3

## 2017-05-31 MED ORDER — METRONIDAZOLE 500 MG PO TABS
500.0000 mg | ORAL_TABLET | Freq: Three times a day (TID) | ORAL | Status: DC
  Administered 2017-05-31 – 2017-06-01 (×4): 500 mg via ORAL
  Filled 2017-05-31 (×4): qty 1

## 2017-05-31 NOTE — Progress Notes (Signed)
PCCM Interval Note  I have reviewed the PET scan. Agree that although R mid-lung hypermetabolic lesion is a target, TTNA would be more straightforward and avoid some risk for resp complications.   Unless there is a well defined soft tissue component to the R 7th rib lesion, I recommend targeting the hepatic lesion in order to avoid need for decalcification of bony mass. This process can make molecular studies in pathology impossible. If there is a soft tissue chest wall target then either site would be good.   Recent Labs  Lab 05/30/17 0600  INR 1.28   Agree with broad abx with anaerobic coverage for post-obstructive PNA.   We will continue to follow.   Baltazar Apo, MD, PhD 05/31/2017, 9:32 AM [facility] [phone] or if no answer [phone]

## 2017-05-31 NOTE — Progress Notes (Signed)
  Date: 05/31/2017  Patient name: Erin Edwards  Medical record number: [id number]  Date of birth: 06-26-1946   I have seen and evaluated Erin Edwards and discussed their care with the Residency Team. Erin Edwards is a 71 year old female who presented to the ED from radiology. She had presented to radiology to have a biopsy but was found to be in respiratory distress and sent to the ED. In June, she had a chest x-ray consistent with pneumonia and was treated with antibiotics but did not improve. A repeat chest x-ray 3 weeks later continued to show the right upper lobe infiltrate and she was treated with a second round of antibiotics. After a second course of antibiotics. The infiltrate persisted and she had a CT scan that showed a right hilar mass, postobstructive pneumonitis, right upper lobe nodule, mediastinal lymphadenopathy, a right pleural effusion, hilar lymphadenopathy, and a right seventh rib met. She was scheduled to have a biopsy in IR on the day of admission. Today, she states she is no longer wheezing and feels a little bit better. She continues to have a cough productive of tissue/blood clot.  Vitals:   05/30/17 2205 05/31/17 0511  BP: (!) 140/59 (!) 136/97  Pulse: (!) 119 86  Resp: 18 18  Temp: 98.6 F (37 C) (!) 97.5 F (36.4 C)  SpO2: 96% 99%  Gen : Obese, lying in bed Resp : using accessory muscle, bc dyspnic with speaking, unable to speak in full sentences. + wheezing B HRRR no MRG ABD + BS, soft  Cr 2.96 Alb 2.4 WBC 18.0 - 18.4 HgB 9.3 Plts 447 Corrected calcium about 10  I personally viewed the CXR images and confirmed my reading with the official read. 1 view, aortic atherosclerosis, consolidation of the right upper lobe  I personally viewed the EKG and confirmed my reading with the official read. Sinus arrhythmia, normal axis, no ischemic changes  Assessment and Plan: I have seen and evaluated the patient as outlined above. I agree with the formulated  Assessment and Plan as detailed in the residents' note, with the following changes: Erin Edwards is a 71 year old female who failed antibiotic treatment 2 for a presumed right upper lobe pneumonia. CT imaging revealed what is likely a primary lung cancer with post obstruction pneumonia. She continues to be in mild non-hypoxic respiratory distress. She is on broad spectrum antibiotics to cover MRSA, pseudomonas, and anaerobes. Her regimen includes vancomycin, cefepime, metronidazole. Since she has wheezing bilaterally, we are using prednisone 40 mg for 5 days to treat a presumed superimposed COPD exacerbation. We are working with IR to obtain tissue diagnosis so that she can then discuss her options with oncology.  1. Acute non-hypoxic respiratory failure  2. Postobstructive pneumonia  3. Superimposed COPD exacerbation Continue antibiotics, oxygen, prednisone, nebulizers  4. Right upper lobe mass - this is highly suspicious for metastatic lung cancer. We are obtaining tissue via IR. We will need to involve oncology once we have a tissue diagnosis.  5. Hypercalcemia - hypercalcemia is only mildly elevated & she is asymptomatic. 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 We will start with a PTH and calcium to help determine if this is PTH mediated or non-PTH mediated.  Bartholomew Crews, MD 11/8/201812:05 PM

## 2017-05-31 NOTE — Progress Notes (Signed)
   Subjective: Doing well this AM. Her SOB has improved but she continues to be dyspneic with conversation. She had another episode of hemoptysis with tissue production. We discussed saving the sample the next time it occurs. She will try to remember. We will touch base with IR today to see if we can get a biopsy while she is in the hospital. All questions and concerns addressed.   Objective: Vital signs in last 24 hours: Vitals:   05/30/17 1740 05/30/17 1949 05/30/17 2205 05/31/17 0511  BP: (!) 160/84  (!) 140/59 (!) 136/97  Pulse: (!) 115 (!) 121 (!) 119 86  Resp: (!) 24 (!) 24 18 18   Temp: 98.2 F (36.8 C)  98.6 F (37 C) (!) 97.5 F (36.4 C)  TempSrc: Oral  Oral Oral  SpO2: 97% 98% 96% 99%  Weight: 184 lb 11.9 oz (83.8 kg)     Height: 5\' 2"  (1.575 m)      General: Obese female in no acute distress Pulm: Dyspneic with conversation, diffuse wheezing  CV: RRR, no murmurs, no rubs Abdomen: Active bowel sounds, soft, no tenderness to palpation  Extremities: Trace LE edema   Assessment/Plan:  1. Postobstructive Pneumonia - Continue Vancomycin, Metronidazole, and Cefepime  - Consulted Pulmonology, not recommending endobronchial biopsy at this time, think the liver lesion would be the most amendable to a biopsy. Will consult IR today. - Procalcitonin 11.79 - Strep and legionella urine antigens pending  - Blood cultures no growth at 24 hours  - Oxygen therapy to maintain oxygen saturation near 92% - Scheduled breathing treatments  - Start Prednisone 40 mg  - Will need to consult radiation oncology  - Although typically low yield, will order sputum cytology for if she again has hemoptysis with tissue production.   2. Right Upper Lobe Mass  - Likely metastatic lung cancer - Hypercalcemia on labs  - Follows with Dr. Halford Chessman - CT chest was pursued that showed a right hilar mass with consolidation, a small right pleural effusion, hilar LAD, and right 7th rib metastasis. - PET scan  illustrated right upper lobe mass consistent with bronchogenic carcinoma, mediastinal lymph nodes, right axillary lymph nodes, bone metastases, liver lesions, left adrenal lesion, and lesion on the gastrosplenic ligament. - Consulted IR for biopsy evaluation   3. COPD - Oxygen therapy to maintain oxygen saturation near 92% - Scheduled breathing treatments  - Prednisone 40 mg for 4 days  4. Renal Dysfunction  - Unclear baseline, Creatinine 2.96  - Urine analysis was significant for hematuria, pyuria, yeast, and calcium oxalate crystals. - Will treat if symptomatic with one time dose of fluconazole  - Renal ultrasound, urine sodium, urine creatinine, and repeat UA today  - Hypercalcemia can cause renal dysfunction   5. Hypertension  - Continue to monitor  - Previously was on Amlodipine 10 mg and Lisinopril 40 mg  - Will restart amlodipine is hypertensive   6. Diabetes Mellitus  - Moderate SSI TID with meals and night time coverage  Dispo: Anticipated discharge in approximately >1 day(s).   Ina Homes, MD 05/31/2017, 5:33 AM My Pager: [phone]

## 2017-05-31 NOTE — Progress Notes (Signed)
Pharmacy Antibiotic Note  Erin Edwards is a 71 y.o. female admitted on 05/30/2017 with postobstructive PNA.  Pt initially presented to radiology for biopsy but was found to have respiratory distress and was sent to ED for admission.  Pt was started on Vancomycin, Cefepime, and Flagyl for post obstructive PNA coverage.   WBC remains elevated at 18, SCr has worsened slightly to 2.9.  Will adjust abx doses.  Plan: Change Cefepime 1 gm IV Q 24 hours Change Vancomycin 1 gm IV Q 48 hours Change Metronidazole 500 mg PO q8h hours   Monitor CBC, renal fx, cultures and clinical progress VT at SS   Height: 5\' 2"  (157.5 cm) Weight: 184 lb 11.9 oz (83.8 kg) IBW/kg (Calculated) : 50.1  Temp (24hrs), Avg:98.1 F (36.7 C), Min:97.5 F (36.4 C), Max:98.6 F (37 C)  Recent Labs  Lab 05/30/17 0600 05/30/17 1030 05/30/17 1046 05/30/17 1234 05/31/17 0358  WBC 18.0*  --   --   --  18.4*  CREATININE  --  2.55*  --   --  2.96*  LATICACIDVEN  --   --  1.13 1.08  --     Estimated Creatinine Clearance: 17.5 mL/min (A) (by C-G formula based on SCr of 2.96 mg/dL (H)).    Allergies  Allergen Reactions  . Codeine   . Penicillins   . Sulfa Antibiotics     Antimicrobials this admission: Vanc 11/7 >> Cefepime 11/7 >> Flagyl 11/7 >>  Dose adjustments this admission:   Microbiology results: 11/7 blood x 2 >> ngtd 11/7 urine >> reincubated   Thank you for allowing pharmacy to be a part of this patient's care.  [employer], Pharm.D., BCPS Clinical Pharmacist Pager: [phone] Clinical phone for 05/31/2017 from 8:30-4:00 is [phone]. After 4pm, please call Main Rx [phone]) for assistance. 05/31/2017 4:48 PM

## 2017-05-31 NOTE — H&P (Signed)
Chief Complaint: Presumed lung cancer  Referring Physician(s): Baltazar Apo  Supervising Physician: Sandi Mariscal  Patient Status: [facility] - In-pt  History of Present Illness: Erin Edwards is a 71 y.o. female with a known right upper lobe/hilar lung mass found in September. PET/CT imaging showed a hypermetabolic mass as well as multiple areas of metastases suspicious for underlying malignancy.   Patient was planned for CT-guided biopsy yesterday.  Upon evaluation prior to procedure and interventional radiology patient was very tachypneic and tachycardic.   With concern for her respiratory status she was transitioned to the emergency room for further evaluation.  She is doing a little better after admission and breathing treatments and would like to proceed with biopsy.   Past Medical History:  Diagnosis Date  . Chronic bronchitis ([place])   . Claudication ([place])   . Diabetes mellitus   . Hyperlipidemia   . Hypertension     Past Surgical History:  Procedure Laterality Date  . ABDOMINAL HYSTERECTOMY    . APPENDECTOMY    . EYE SURGERY     cataract removal  . ROTATOR CUFF REPAIR      Allergies: Codeine; Penicillins; and Sulfa antibiotics  Medications: Prior to Admission medications   Medication Sig Start Date End Date Taking? Authorizing Provider  amlodipine-atorvastatin (CADUET) 10-10 MG tablet     [provider]  atorvastatin (LIPITOR) 10 MG tablet     [provider]  buPROPion (WELLBUTRIN) 100 MG tablet     [provider]  Dulaglutide (TRULICITY) 4.40 HK/7.4QV SOPN     [provider]  fluticasone furoate-vilanterol (BREO ELLIPTA) 100-25 MCG/INH AEPB     [provider]  gabapentin (NEURONTIN) 300 MG capsule     [provider]  lisinopril (PRINIVIL,ZESTRIL) 40 MG tablet     [provider]  rosuvastatin (CRESTOR) 10 MG tablet     [provider]  sertraline (ZOLOFT) 100 MG tablet      [provider]     Family History  Problem Relation Age of Onset  . Cancer Mother        breast cancer  . Heart disease Father   . Diabetes Father     Social History   Socioeconomic History  . Marital status: Widowed    Spouse name: None  . Number of children: None  . Years of education: None  . Highest education level: None  Social Needs  . Financial resource strain: None  . Food insecurity - worry: None  . Food insecurity - inability: None  . Transportation needs - medical: None  . Transportation needs - non-medical: None  Occupational History  . None  Tobacco Use  . Smoking status: Former Smoker    Packs/day: 1.50    Years: 50.00    Pack years: 75.00    Types: Cigarettes    Last attempt to quit: 2015    Years since quitting: 3.8  . Smokeless tobacco: Never Used  Substance and Sexual Activity  . Alcohol use: No  . Drug use: No  . Sexual activity: None  Other Topics Concern  . None  Social History Narrative   [facility] (05/30/17):   Lives alone. Widow. 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 Originally from [place]. She moved to [place] approximately 50 years ago.    Review of Systems: A 12 point ROS discussed Review of Systems  Constitutional: Positive for activity change.  HENT: Negative.   Respiratory: Positive for cough, shortness of  breath and wheezing.   Cardiovascular: Negative.   Gastrointestinal: Negative.   Genitourinary: Negative.   Musculoskeletal: Negative.   Neurological: Negative.   Hematological: Negative.   Psychiatric/Behavioral: Negative.     Vital Signs: BP (!) 136/97 (BP Location: Right Arm)   Pulse 86   Temp (!) 97.5 F (36.4 C) (Oral)   Resp 18   Ht 5\' 2"  (1.575 m)   Wt 184 lb 11.9 oz (83.8 kg)   SpO2 99%   BMI 33.79 kg/m   Physical Exam  Constitutional: She is oriented to person, place, and time.  Obese  HENT:  Head: Normocephalic and atraumatic.  Eyes: EOM are normal.  Neck: Normal range of  motion.  Cardiovascular: Normal rate, regular rhythm and normal heart sounds.  Pulmonary/Chest: She has wheezes in the right upper field and the right middle field. She has rhonchi in the right upper field and the right middle field.  Abdominal: Soft.  Musculoskeletal: Normal range of motion.       Right lower leg: Normal.       Left lower leg: Normal.  Neurological: She is alert and oriented to person, place, and time.  Skin: Skin is warm and dry.  Psychiatric: She has a normal mood and affect. Her behavior is normal. Judgment and thought content normal.  Vitals reviewed.   Imaging: US Renal  Result Date: 05/31/2017 CLINICAL DATA:  Acute kidney injury, diabetes mellitus, hypertension, former smoker EXAM: RENAL / URINARY TRACT ULTRASOUND COMPLETE COMPARISON:  PET-CT 05/16/2017 FINDINGS: FINDINGS Right Kidney: Length: 10.7 cm. Normal cortical thickness. Slightly increased cortical echogenicity. Multiple cysts, largest at inferior pole 6.0 x 5.8 x 7.2 cm. No solid mass or hydronephrosis. No shadowing calculi. Left Kidney: Length: 11.5 cm. Mild cortical thinning. Minimally increased cortical echogenicity. Multiple cysts, largest 4.1 x 4.0 x 4.4 cm. No solid mass or hydronephrosis. No shadowing calculi. Bladder: Partially distended, grossly unremarkable IMPRESSION: Question medical renal disease changes. Multiple BILATERAL renal cysts without hydronephrosis. Electronically Signed   By: Lavonia Dana M.D.   On: 05/31/2017 13:37   Nm Pet Image Initial (pi) Skull Base To Thigh  Result Date: 05/16/2017 CLINICAL DATA:  Initial treatment strategy for right lung mass. EXAM: NUCLEAR MEDICINE PET SKULL BASE TO THIGH TECHNIQUE: 9.6 mCi F-18 FDG was injected intravenously. Full-ring PET imaging was performed from the skull base to thigh after the radiotracer. CT data was obtained and used for attenuation correction and anatomic localization. FASTING BLOOD GLUCOSE:  Value: 174 mg/dl COMPARISON:  CT on 04/12/2017  FINDINGS: NECK:  No hypermetabolic lymph nodes or masses. CHEST: Central right upper lobe mass is seen which measures approximately 6.6 cm and is hypermetabolic, with SUV max of 15.2. This is contiguous with the right hilum. Postobstructive pneumonitis is seen in the peripheral right upper lobe, which is also hypermetabolic. Small hypermetabolic right paratracheal, precarinal, and subcarinal lymph nodes are seen, largest in the right paratracheal region measuring 10 mm on image 57/4 with SUV max of 4.6. Tiny right pleural effusion shows no significant change or hypermetabolic activity. No suspicious left lung nodules seen on CT images. Stable postop changes from bilateral mastectomies. Mild right axillary lymphadenopathy noted, largest measuring 1.3 cm, with SUV max of 6.9. ABDOMEN/PELVIS: A subtle 2.4 cm low-attenuation lesion is seen in segment 3 of the left lobe, which appears new since previous study, and has FDG uptake. SUV max measures 8.4. No other hypermetabolic liver lesions identified. Gallbladder is unremarkable. No evidence of biliary ductal dilatation. A 10 mm  left adrenal nodule shows focal hypermetabolic activity with SUV max of 4.6. Mild right adrenal thickening again seen without associated hypermetabolic activity. A 2.1 cm hypermetabolic soft tissue density is seen in the upper gastrosplenic ligament on image 94/4. No hypermetabolic lymph nodes identified. There are several tiny sub-cm nodules seen in the left lower abdominal wall subcutaneous fat, with highest SUV max of 3.9. SKELETON: Hypermetabolic soft tissue mass is seen involving the right lateral seventh rib which measures 2.5 x 4.1 cm and has SUV max of 11.2. Other focal areas of hypermetabolism seen within the thoracic and lumbar spine and right iliac crest, consistent with bone metastases. IMPRESSION: Central right upper lobe hypermetabolic mass with involvement of the right hilum, consistent with bronchogenic carcinoma. Postobstructive  pneumonitis in the right upper lobe. Small mediastinal lymph node metastases, and possible small right axillary lymph node metastases. Hypermetabolic bone metastases. Hypermetabolic liver metastasis in the left lobe, and hypermetabolic left adrenal nodule suspicious for adrenal metastasis. Hypermetabolic left upper quadrant soft tissue density in the gastrosplenic ligament, consistent with metastatic disease. Several tiny sub-cm hypermetabolic nodules in left lower abdominal wall subcutaneous fat are nonspecific, however soft tissue metastases cannot be excluded. Electronically Signed   By: Earle Gell M.D.   On: 05/16/2017 14:27   Dg Chest Port 1 View  Result Date: 05/30/2017 CLINICAL DATA:  Leukocytosis.  Dyspnea. EXAM: PORTABLE CHEST 1 VIEW COMPARISON:  03/24/2017 FINDINGS: The large central right upper lobe mass is larger. Opacity extending to the lateral right chest wall is associated. Heterogeneous opacities surrounding the mass extending towards the right apex have increased worrisome for postobstructive pneumonitis. A destructive lesion of a right lateral mid rib is worse with enlargement of the associated soft tissue mass. No Kerley B lines to suggest volume overload. Vascularity is within normal limits. Heart is normal in size. No pneumothorax. No pleural effusion. IMPRESSION: Large right upper lobe mass is worse. Associated postobstructive pneumonitis is worse. No evidence of CHF. Electronically Signed   By: Marybelle Killings M.D.   On: 05/30/2017 08:58    Labs:  CBC: Recent Labs    05/30/17 0600 05/31/17 0358  WBC 18.0* 18.4*  HGB 10.6* 9.3*  HCT 33.7* 30.6*  PLT 544* 447*    COAGS: Recent Labs    05/30/17 0600  INR 1.28  APTT 30    BMP: Recent Labs    05/30/17 1030 05/31/17 0358  NA 139 139  K 4.0 4.8  CL 107 109  CO2 22 19*  GLUCOSE 152* 189*  BUN 57* 59*  CALCIUM 9.0 8.7*  CREATININE 2.55* 2.96*  GFRNONAA 18* 15*  GFRAA 21* 17*    LIVER FUNCTION TESTS: Recent  Labs    05/30/17 1030  BILITOT 0.6  AST 13*  ALT 25  ALKPHOS 73  PROT 6.3*  ALBUMIN 2.4*    TUMOR MARKERS: No results for input(s): AFPTM, CEA, CA199, CHROMGRNA in the last 8760 hours.  Assessment and Plan:  Presumed lung cancer  Acute shortness of breath with hypoxia improved.  Will proceed with image guided biopsy of rib lesion tomorrow.  Risks and benefits discussed with the patient including, but not limited to bleeding, infection, damage to adjacent structures or low yield requiring additional tests.  All of the patient's questions were answered, patient is agreeable to proceed. Consent signed and in chart.  Thank you for this interesting consult.  I greatly enjoyed meeting MIYOKO HASHIMI and look forward to participating in their care.  A copy of this  report was sent to the requesting provider on this date.  Electronically Signed: Murrell Redden, PA-C 05/31/2017, 2:54 PM   I spent a total of  25 Minutes in face to face in clinical consultation, greater than 50% of which was counseling/coordinating care for biopsy of rib lesion.

## 2017-06-01 ENCOUNTER — Inpatient Hospital Stay (HOSPITAL_COMMUNITY): Payer: Medicare PPO

## 2017-06-01 DIAGNOSIS — N179 Acute kidney failure, unspecified: Secondary | ICD-10-CM

## 2017-06-01 DIAGNOSIS — N189 Chronic kidney disease, unspecified: Secondary | ICD-10-CM

## 2017-06-01 LAB — CBC
HCT: 30.5 % — ABNORMAL LOW (ref 36.0–46.0)
Hemoglobin: 9.2 g/dL — ABNORMAL LOW (ref 12.0–15.0)
MCH: 27.1 pg (ref 26.0–34.0)
MCHC: 30.2 g/dL (ref 30.0–36.0)
MCV: 90 fL (ref 78.0–100.0)
PLATELETS: 526 10*3/uL — AB (ref 150–400)
RBC: 3.39 MIL/uL — ABNORMAL LOW (ref 3.87–5.11)
RDW: 15.8 % — ABNORMAL HIGH (ref 11.5–15.5)
WBC: 27.2 10*3/uL — ABNORMAL HIGH (ref 4.0–10.5)

## 2017-06-01 LAB — GLUCOSE, CAPILLARY
GLUCOSE-CAPILLARY: 147 mg/dL — AB (ref 65–99)
GLUCOSE-CAPILLARY: 153 mg/dL — AB (ref 65–99)
Glucose-Capillary: 265 mg/dL — ABNORMAL HIGH (ref 65–99)
Glucose-Capillary: 178 mg/dL — ABNORMAL HIGH (ref 65–99)

## 2017-06-01 LAB — BASIC METABOLIC PANEL
Anion gap: 11 (ref 5–15)
BUN: 63 mg/dL — ABNORMAL HIGH (ref 6–20)
CALCIUM: 8.3 mg/dL — AB (ref 8.9–10.3)
CO2: 19 mmol/L — ABNORMAL LOW (ref 22–32)
CREATININE: 3 mg/dL — AB (ref 0.44–1.00)
Chloride: 112 mmol/L — ABNORMAL HIGH (ref 101–111)
GFR calc non Af Amer: 15 mL/min — ABNORMAL LOW (ref >60)
GFR, EST AFRICAN AMERICAN: 17 mL/min — AB (ref >60)
Glucose, Bld: 171 mg/dL — ABNORMAL HIGH (ref 65–99)
Potassium: 3.9 mmol/L (ref 3.5–5.1)
Sodium: 142 mmol/L (ref 135–145)

## 2017-06-01 LAB — PTH, INTACT AND CALCIUM
CALCIUM TOTAL (PTH): 8.9 mg/dL (ref 8.7–10.3)
PTH: 114 pg/mL — AB (ref 15–65)

## 2017-06-01 LAB — LEGIONELLA PNEUMOPHILA SEROGP 1 UR AG: L. PNEUMOPHILA SEROGP 1 UR AG: NEGATIVE

## 2017-06-01 MED ORDER — LIDOCAINE HCL 1 % IJ SOLN
INTRAMUSCULAR | Status: AC
  Filled 2017-06-01: qty 20

## 2017-06-01 MED ORDER — METRONIDAZOLE 500 MG PO TABS: 500.0000 mg | ORAL_TABLET | Freq: Three times a day (TID) | ORAL | 0 refills | Status: AC

## 2017-06-01 MED ORDER — LEVOFLOXACIN 750 MG PO TABS: 750.0000 mg | ORAL_TABLET | Freq: Two times a day (BID) | ORAL | 0 refills | Status: AC

## 2017-06-01 MED ORDER — HYDROCODONE-ACETAMINOPHEN 5-325 MG PO TABS: 1.0000 | ORAL_TABLET | Freq: Four times a day (QID) | ORAL | 0 refills | Status: AC | PRN

## 2017-06-01 MED ORDER — LIDOCAINE-EPINEPHRINE 1 %-1:100000 IJ SOLN
INTRAMUSCULAR | Status: AC
  Administered 2017-06-01: 11:00:00
  Filled 2017-06-01: qty 1

## 2017-06-01 MED ORDER — PREDNISONE 20 MG PO TABS: 40.0000 mg | ORAL_TABLET | Freq: Every day | ORAL | 0 refills | Status: AC

## 2017-06-01 MED ORDER — FENTANYL CITRATE (PF) 100 MCG/2ML IJ SOLN
INTRAMUSCULAR | Status: AC
  Filled 2017-06-01: qty 2

## 2017-06-01 MED ORDER — DOXYCYCLINE HYCLATE 50 MG PO CAPS: 100.0000 mg | ORAL_CAPSULE | Freq: Two times a day (BID) | ORAL | 0 refills | Status: AC

## 2017-06-01 MED ORDER — ENOXAPARIN SODIUM 40 MG/0.4ML ~~LOC~~ SOLN: 0.5000 mg/kg | SUBCUTANEOUS | 0 refills | Status: DC

## 2017-06-01 MED ORDER — MIDAZOLAM HCL 2 MG/2ML IJ SOLN
INTRAMUSCULAR | Status: AC
  Filled 2017-06-01: qty 2

## 2017-06-01 MED ORDER — ENOXAPARIN SODIUM 30 MG/0.3ML ~~LOC~~ SOLN: 30.0000 mg | SUBCUTANEOUS | Status: DC

## 2017-06-01 MED ORDER — ENOXAPARIN SODIUM 40 MG/0.4ML ~~LOC~~ SOLN: 40.0000 mg | SUBCUTANEOUS | 0 refills | Status: AC

## 2017-06-01 MED ORDER — MIDAZOLAM HCL 2 MG/2ML IJ SOLN
INTRAMUSCULAR | Status: AC | PRN
  Administered 2017-06-01 (×2): 1 mg via INTRAVENOUS

## 2017-06-01 MED ORDER — FENTANYL CITRATE (PF) 100 MCG/2ML IJ SOLN
INTRAMUSCULAR | Status: AC | PRN
  Administered 2017-06-01: 50 ug via INTRAVENOUS

## 2017-06-01 NOTE — Discharge Summary (Signed)
Name: Erin Edwards MRN: [id number] DOB: [birth year] 71 y.o. PCP: Monico Blitz, MD  Date of Admission: 05/30/2017  9:14 AM Date of Discharge: 06/01/2017 Attending Physician: Bartholomew Crews, MD  Discharge Diagnosis: 1. Postobstructive pneumonia  2. Acute Kidney Injury   Active Problems:   Postobstructive pneumonia  Discharge Medications: Allergies as of 06/01/2017      Reactions   Codeine    Penicillins    Sulfa Antibiotics       Medication List    STOP taking these medications   atorvastatin 10 MG tablet Commonly known as:  LIPITOR   rosuvastatin 10 MG tablet Commonly known as:  CRESTOR     TAKE these medications   amlodipine-atorvastatin 10-10 MG tablet Commonly known as:  CADUET   BREO ELLIPTA 100-25 MCG/INH Aepb Generic drug:  fluticasone furoate-vilanterol   buPROPion 100 MG tablet Commonly known as:  WELLBUTRIN   doxycycline 50 MG capsule Commonly known as:  VIBRAMYCIN Take 2 capsules (100 mg total) 2 (two) times daily for 5 days by mouth.   enoxaparin 40 MG/0.4ML injection Commonly known as:  LOVENOX Inject 0.4 mLs (40 mg total) daily for 10 days into the skin.   gabapentin 300 MG capsule Commonly known as:  NEURONTIN   HYDROcodone-acetaminophen 5-325 MG tablet Commonly known as:  NORCO/VICODIN Take 1-2 tablets every 6 (six) hours as needed for up to 5 days by mouth for moderate pain.   levofloxacin 750 MG tablet Commonly known as:  LEVAQUIN Take 1 tablet (750 mg total) 2 (two) times daily for 5 days by mouth.   lisinopril 40 MG tablet Commonly known as:  PRINIVIL,ZESTRIL   metroNIDAZOLE 500 MG tablet Commonly known as:  FLAGYL Take 1 tablet (500 mg total) every 8 (eight) hours for 5 days by mouth.   predniSONE 20 MG tablet Commonly known as:  DELTASONE Take 2 tablets (40 mg total) daily with breakfast for 2 days by mouth. Start taking on:  06/02/2017   sertraline 100 MG tablet Commonly known as:  ZOLOFT   TRULICITY 4.17  EY/8.1KG Sopn Generic drug:  Dulaglutide            Durable Medical Equipment  (From admission, onward)        Start     Ordered   06/01/17 1527  For home use only DME Walker rolling  Once    Question:  Patient needs a walker to treat with the following condition  Answer:  Weakness   06/01/17 1528     Disposition and follow-up:   Erin Edwards was discharged from [facility] in Stable condition.  At the hospital follow up visit please address:  1.  PNA. Please assess whether she completed her antibiotic course or not. AKI. Please preform a BMP to assess her renal function.   2.  Labs / imaging needed at time of follow-up: BMP  3.  Pending labs/ test needing follow-up: Right 7th Rib Bone Biopsy   Follow-up Appointments: Follow-up Information    Monico Blitz, MD Follow up.   Specialty:  Internal Medicine Contact information: [place] [address] [phone]        Chesley Mires, MD Follow up.   Specialty:  Pulmonary Disease Contact information: [address] [place] [address] [phone]          Hospital Course by problem list: Active Problems:   Postobstructive pneumonia   1. Postobstructive pneumonia. Erin Edwards is a 71 y.o. Female who  has signs and symptoms of primary lung cancer who presented to the ED with worsening symptoms. He newly developed hemoptysis, leukocytosis, and worsening SOB are consistent with pneumonia. Her structural lung disease, frequent antibiotic use, and imaging consistent with primary lung cancer place her at elevated risk for multidrug resistant organisms. She was initially treated with Levaquin and Cefepime. Her antibiotics were initially broadened to Vancomycin, Cefepime, and Metronidazole. While she was in the hospital IR was consulted and preformed a right 7th rib bone biopsy. She was hemodynamically stable after the procedure without any immediate complications. She was assessed for the need  of home oxygen but her oxygen saturation was found to be 96% with ambulation on RA. She uses oxygen primarily for comfort. She wanted to return home after the procedure. She plans to move to [place] with her son and pursue care there. The risks of leaving were discussed and the patient and her son agreed with being discharged and transitioning to [place]. She was given a digital copy of her PET scan. She was given a prescription for Ciprofloxacin, Doxycycline, Metronidazole, Prednisone, and SubQ Lovenox.   2. Acute Kidney Injury. On presentation Erin Edwards's creatinine was found to be elevated to 2.55. Renal ultrasound illustrated findings of chronic kidney disease and UA showed signs of possible acute interstitial nephritis. We recommend rechecking her BMP 1 week post discharge.   Discharge Vitals:   BP 117/83 (BP Location: Left Arm)   Pulse (!) 109   Temp 98 F (36.7 C) (Oral)   Resp (!) 32   Ht 5\' 2"  (1.575 m)   Wt 184 lb 11.9 oz (83.8 kg)   SpO2 98%   BMI 33.79 kg/m   Discharge Instructions: Discharge Instructions    Diet - low sodium heart healthy   Complete by:  As directed    Increase activity slowly   Complete by:  As directed      Signed: Ina Homes, MD 06/01/2017, 3:30 PM   My Pager: [phone]

## 2017-06-01 NOTE — Progress Notes (Signed)
  Date: 06/01/2017  Patient name: Erin Edwards  Medical record number: [id number]  Date of birth: 07-Nov-1945   I have seen and evaluated this patient and I have discussed the plan of care with the house staff. Please see their note for complete details. I concur with their findings with the following additions/corrections: [name] was seen on AM rounds with son at bedside. Remains tachycardiac, tachypnic, and using accessory muscles. Agree with Dr Jerrell Mylar plan to D/C home (IR got tissue) and notify pt of bx results via phone. Son is arranging onc F/U in [place].  Bartholomew Crews, MD 06/01/2017, 3:13 PM

## 2017-06-01 NOTE — Discharge Instructions (Signed)
Thank you for allowing [place] to provide your care.   - Be safe in your move to [place].   - I have sent out a prescription for 3 different antibiotics. Please take them as prescribed.   - Please continue to take the prednisone as prescribed.   - I will call you with the results of your biopsy when they are available.

## 2017-06-01 NOTE — Progress Notes (Signed)
Theodosia Blender, Theo Dills, RN        # 5. [facility]  @ [place] # [phone]   1. LOVENOC 40 MG X [place] # [phone]    2. ENOXAPARIN 40 MG X 10 DAYS  COVER- YES  CO-PAY- $ 35.25  PRIOR APPROVAL- NO   PREFERRED PHARMACY : [facility]   Previous Messages    ----- Message -----  From: Cecille Rubin, RN  Sent: 06/01/2017  3:30 PM  To: [facility] Case Mgr Assistant  Subject: Benefits check                  Please check copay for Lovenox 40 mg x 10 days. Thanks      Whitman Hero RN,BSN,CM

## 2017-06-01 NOTE — Procedures (Signed)
Pre procedural Dx: Rib Lesion  Post procedural Dx: Same  Technically successful CT guided biopsy of indeterminate lesion within the right 7th rib.   EBL: None.   Complications: None immediate.   Ronny Bacon, MD Pager #: [phone]

## 2017-06-01 NOTE — Progress Notes (Signed)
   Subjective: Doing well this AM. Continues to wheeze but went asked she said that she is at her baseline. She always wheezes and it never gets better or worse. Would like to go home today. Discussed that she will go for a biopsy today and depending on how she does after the procedure and the time (45 minute drive home). We may be able to get her out. But will wait to see how she does post-procedure. Son is requesting copies of her scans and results. Plan is for her to move in with him in [place] on discharge.   We will need an ambulatory pulse ox prior to discharge to determine if she needs home oxygen.   Objective: Vital signs in last 24 hours: Vitals:   05/31/17 1613 05/31/17 1615 05/31/17 2009 05/31/17 2132  BP: (!) 103/42 (!) 140/48  (!) 160/69  Pulse: (!) 125 (!) 123 (!) 127 (!) 121  Resp: 20  20 20   Temp:    97.7 F (36.5 C)  TempSrc:    Oral  SpO2: 97% 99% 98% 95%  Weight:      Height:       General: Obese female with dyspnea on conversation  Pulm: Auditory wheezing with conversation. Diffuse wheezing  CV: RRR, no murmurs, no wheezing  Abdomen: Active bowel sounds, soft, no tenderness to palpation  Extremities: Trace LE edema, warm and dry   Assessment/Plan:  1. Postobstructive Pneumonia - Procalcitonin 11.79 - Strep urine antigen negative  - Legionella urine antigen pending  - Blood and urine cultures showing no growth at 24 hours  - Oxygen therapy to maintain oxygen saturation near 92% - Scheduled breathing treatments  - Continue Prednisone 40 mg (Day 3/5) - Continue Vancomycin, Metronidazole, and Cefepime (Day 3)  2. Right Upper Lobe Mass  - Likely metastatic lung cancer  - Follows with Dr. Halford Chessman - Consulted IR for biopsy evaluation, plan is for CT guided biopsy of the rib metastasis or lymph node - Although typically low yield, will order sputum cytology for if she again has hemoptysis with tissue production.   3. COPD - Oxygen therapy to maintain oxygen  saturation near 92% - Scheduled breathing treatments  - Prednisone 40 mg (Day 3/5)  4. Renal Dysfunction  - Unclear baseline, Creatinine 3.00  - Renal ultrasound illustrating come findings consistent with chronic kidney disease  - FeNa >1% - UA illustrating hematuria and WBC casts which is indicative of AIN - Will check free light chains and consider further glomerulonephritis work-up   5. Hypertension  - Continue to monitor  - Previously was on Amlodipine 10 mg and Lisinopril 40 mg  - Will restart amlodipine is hypertensive   6. Diabetes Mellitus  - Moderate SSI TID with meals and night time coverage  Dispo: Anticipated discharge in approximately >1 day(s).   Ina Homes, MD 06/01/2017, 5:08 AM My Pager: [phone]

## 2017-06-01 NOTE — Progress Notes (Signed)
Benefits check in process for Lovenox 40 mg daily x10 days. CM to f/u results. Whitman Hero RN,BSN,CM

## 2017-06-01 NOTE — Progress Notes (Signed)
Pulmonary MD note - patient not seen  - d./w Dr Ina Homes resident  - IR plans for axillary node biopsy and Rt 7th rib 06/01/2017   Plan pccm will sign off IF above bx are non diagnostic, plan liver biopsy per prior PCCM MD rec   Dr. Brand Males, M.D., [facility].C.P Pulmonary and Critical Care Medicine Staff Physician [facility] Pager: [phone], If no answer or between  15:00h - 7:00h: call [phone]  06/01/2017 8:19 AM

## 2017-06-01 NOTE — Evaluation (Signed)
Physical Therapy Evaluation Patient Details Name: Erin Edwards MRN: [id number] DOB: 04/11/1946 Today's Date: 06/01/2017   History of Present Illness  71 y.o. Female with a PMHx significant for HTN, DM, COPD, tobacco abuse, and bilateral breast cancer s/p mastectomy who presented to the ED from interventional radiology with worsening shortness of breath. 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 She was at IR to undergo rib biopsy.     Clinical Impression  Pt admitted with above diagnosis. Pt currently with functional limitations due to the deficits listed below (see PT Problem List). On eval, pt required min guard assist transfers and gait 40 feet with RW. Pt ambulated on RA, SpO2 96% and max HR 147. Distance limited by tachycardia. Increased WOB noted throughout session. Pt returned to supine after mobility with SpO2 98% and HR 117. Pt placed back on 3 L O2 via [place] due to increased WOB.  PTA pt lived at home alone. At discharge, she will move to [place] with her son. He is currently in town organizing the move. Pt will benefit from skilled PT to increase their independence and safety with mobility to allow discharge to the venue listed below.      Follow Up Recommendations Home health PT;Supervision for mobility/OOB    Equipment Recommendations  Rolling walker with 5" wheels    Recommendations for Other Services       Precautions / Restrictions Precautions Precautions: Fall;Other (comment) Precaution Comments: watch HR and O2      Mobility  Bed Mobility Overal bed mobility: Needs Assistance Bed Mobility: Supine to Sit;Sit to Supine     Supine to sit: Supervision Sit to supine: Supervision   General bed mobility comments: +rail, increased time and effort, verbal cues for sequencing  Transfers Overall transfer level: Needs assistance Equipment used: Rolling  walker (2 wheeled) Transfers: Sit to/from [facility] Sit to Stand: Min guard Stand pivot transfers: Min guard       General transfer comment: verbal cues for hand placement  Ambulation/Gait Ambulation/Gait assistance: Min guard Ambulation Distance (Feet): 40 Feet Assistive device: Rolling walker (2 wheeled) Gait Pattern/deviations: Step-through pattern;Decreased stride length Gait velocity: decreased Gait velocity interpretation: Below normal speed for age/gender General Gait Details: Pt ambulated on RA. EOB SpO2 98% HR 122. During ambulation max HR 147 and SpO2 96%. EOB after mobility HR 126 SpO2 99%. Increased WOB noted throughout session.  Stairs            Wheelchair Mobility    Modified Rankin (Stroke Patients Only)       Balance Overall balance assessment: Needs assistance Sitting-balance support: No upper extremity supported;Feet supported Sitting balance-Leahy Scale: Good     Standing balance support: Bilateral upper extremity supported Standing balance-Leahy Scale: Fair Standing balance comment: RW needed for ambulation                             Pertinent Vitals/Pain Pain Assessment: No/denies pain    Home Living Family/patient expects to be discharged to:: Private residence Living Arrangements: Children Available Help at Discharge: Family;Available PRN/intermittently Type of Home: House Home Access: Stairs to enter Entrance Stairs-Rails: None Entrance Stairs-Number of Steps: 2 Home Layout: Two level;Able to live on main level with bedroom/bathroom Home Equipment: None      Prior Function Level of Independence: Independent  Comments: PTA pt lived at home alone. At d/c she will return to [place] with her son. He plans to get a PCA to assist while he is at work.     Hand Dominance        Extremity/Trunk Assessment                Communication   Communication: No difficulties  Cognition  Arousal/Alertness: Awake/alert Behavior During Therapy: WFL for tasks assessed/performed Overall Cognitive Status: Within Functional Limits for tasks assessed                                        General Comments      Exercises     Assessment/Plan    PT Assessment Patient needs continued PT services  PT Problem List Decreased strength;Decreased activity tolerance;Decreased mobility;Decreased balance;Cardiopulmonary status limiting activity;Decreased knowledge of use of DME       PT Treatment Interventions DME instruction;Balance training;Gait training;Stair training;Functional mobility training;Patient/family education;Therapeutic activities;Therapeutic exercise    PT Goals (Current goals can be found in the Care Plan section)  Acute Rehab PT Goals Patient Stated Goal: get biopsy completed PT Goal Formulation: With patient/family Time For Goal Achievement: 06/08/17 Potential to Achieve Goals: Good    Frequency Min 3X/week   Barriers to discharge        Co-evaluation               AM-PAC PT "6 Clicks" Daily Activity  Outcome Measure Difficulty turning over in bed (including adjusting bedclothes, sheets and blankets)?: A Little Difficulty moving from lying on back to sitting on the side of the bed? : A Little Difficulty sitting down on and standing up from a chair with arms (e.g., wheelchair, bedside commode, etc,.)?: A Little Help needed moving to and from a bed to chair (including a wheelchair)?: A Little Help needed walking in hospital room?: A Little Help needed climbing 3-5 steps with a railing? : A Little 6 Click Score: 18    End of Session Equipment Utilized During Treatment: Gait belt Activity Tolerance: Treatment limited secondary to medical complications (Comment)(tachy) Patient left: in bed;with call bell/phone within reach;with family/visitor present Nurse Communication: Mobility status PT Visit Diagnosis: History of falling  (Z91.81);Difficulty in walking, not elsewhere classified (R26.2)    Time: 7672-0947 PT Time Calculation (min) (ACUTE ONLY): 37 min   Charges:   PT Evaluation $PT Eval Moderate Complexity: 1 Mod PT Treatments $Gait Training: 8-22 mins   PT G Codes:        Lorrin Goodell, PT  Office # [phone] Pager [phone]   Lorriane Shire 06/01/2017, 9:43 AM

## 2017-06-01 NOTE — Plan of Care (Signed)
  No Outcome Acute Rehab PT Goals(only PT should resolve) Patient Will Transfer Sit To/From Stand 06/01/2017 0946 by Philippa Sicks, PT Flowsheets Taken 06/01/2017 [phone]  Patient will transfer sit to/from stand with supervision Pt Will Transfer Bed To Chair/Chair To Bed 06/01/2017 0946 by Philippa Sicks, PT Flowsheets Taken 06/01/2017 0946  Pt will Transfer Bed to Chair/Chair to Bed with supervision Pt Will Ambulate 06/01/2017 0946 by Trevontae Lindahl, [place] Taken 06/01/2017 0946  Pt will Ambulate > 125 feet;with supervision;with rolling walker Pt Will Go Up/Down Stairs 06/01/2017 0946 by [place] Taken 06/01/2017 0946  Pt will Go Up / Down Stairs 1-2 stairs;with minimal assist;with least restrictive assistive device

## 2017-06-01 NOTE — Progress Notes (Signed)
NURSING PROGRESS NOTE  Erin Edwards [id number] Discharge Data: 06/01/2017 5:32 PM Attending Provider: Bartholomew Crews, MD EIH:DTPN, Weldon Picking, MD   Loletta Parish to be D/C'd Home per MD order.    All IV's will be discontinued and monitored for bleeding.  All belongings will be returned to patient for patient to take home.  Last Documented Vital Signs:  Blood pressure 117/83, pulse (!) 109, temperature 98 F (36.7 C), temperature source Oral, resp. rate (!) 32, height 5\' 2"  (1.575 m), weight 83.8 kg (184 lb 11.9 oz), SpO2 98 %.  Joslyn Hy, MSN, RN, [employer]

## 2017-06-02 LAB — URINE CULTURE

## 2017-06-04 LAB — CULTURE, BLOOD (ROUTINE X 2)
Culture: NO GROWTH
Culture: NO GROWTH

## 2017-06-04 LAB — KAPPA/LAMBDA LIGHT CHAINS
Kappa free light chain: 46.9 mg/L — ABNORMAL HIGH (ref 3.3–19.4)
Kappa, lambda light chain ratio: 2.03 — ABNORMAL HIGH (ref 0.26–1.65)
LAMDA FREE LIGHT CHAINS: 23.1 mg/L (ref 5.7–26.3)

## 2017-06-04 LAB — GLUCOSE, CAPILLARY: GLUCOSE-CAPILLARY: 284 mg/dL — AB (ref 65–99)

## 2017-06-05 ENCOUNTER — Ambulatory Visit (HOSPITAL_COMMUNITY): Payer: Medicare PPO

## 2017-06-07 ENCOUNTER — Telehealth: Payer: Self-pay | Admitting: Pulmonary Disease

## 2017-06-07 NOTE — Telephone Encounter (Signed)
Upon inspection of patient's chart, a CT Biopsy was ordered by Dr Larey Dresser while pt was admitted to the hospital - procedure was done on 11.9.18.  Will call pt's son to inform him this is why he has not heard from this office - [facility] did not order the test and therefore did not receive the results for this test.  Called spoke with patient's son Alvester Chou and discussed the above.  Advised Alvester Chou will be happy to send this to [facility] to see if he can view the results.  Alvester Chou responded that they "just need to hear from somebody about the results."  Apologized to [facility] for any inconvenience and reported will ask [facility] to view the results.  Alvester Chou voiced his understanding.  Because of Barry's response that they have not yet received the results, will mark this message as urgent.  Dr Halford Chessman, can you please view the 11.9.18 CT Biopsy results for the patient?  Thank you.

## 2017-06-07 NOTE — Telephone Encounter (Signed)
Spoke with pt's son.  Pt is now in [place].  He needs to get copy of her biopsy results.  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  Advised him to call medical records to have results faxed to hospital in [place].

## 2017-06-23 DEATH — deceased

## 2017-07-27 ENCOUNTER — Ambulatory Visit: Payer: Medicare PPO | Admitting: Pulmonary Disease

## 2018-12-18 IMAGING — DX DG CHEST 1V PORT
1 series · 1 of 1 positions shown · non-contrast
Comparison: 03/24/2017

CLINICAL DATA: Leukocytosis.  Dyspnea.

EXAM:
PORTABLE CHEST 1 VIEW

[chest ap]
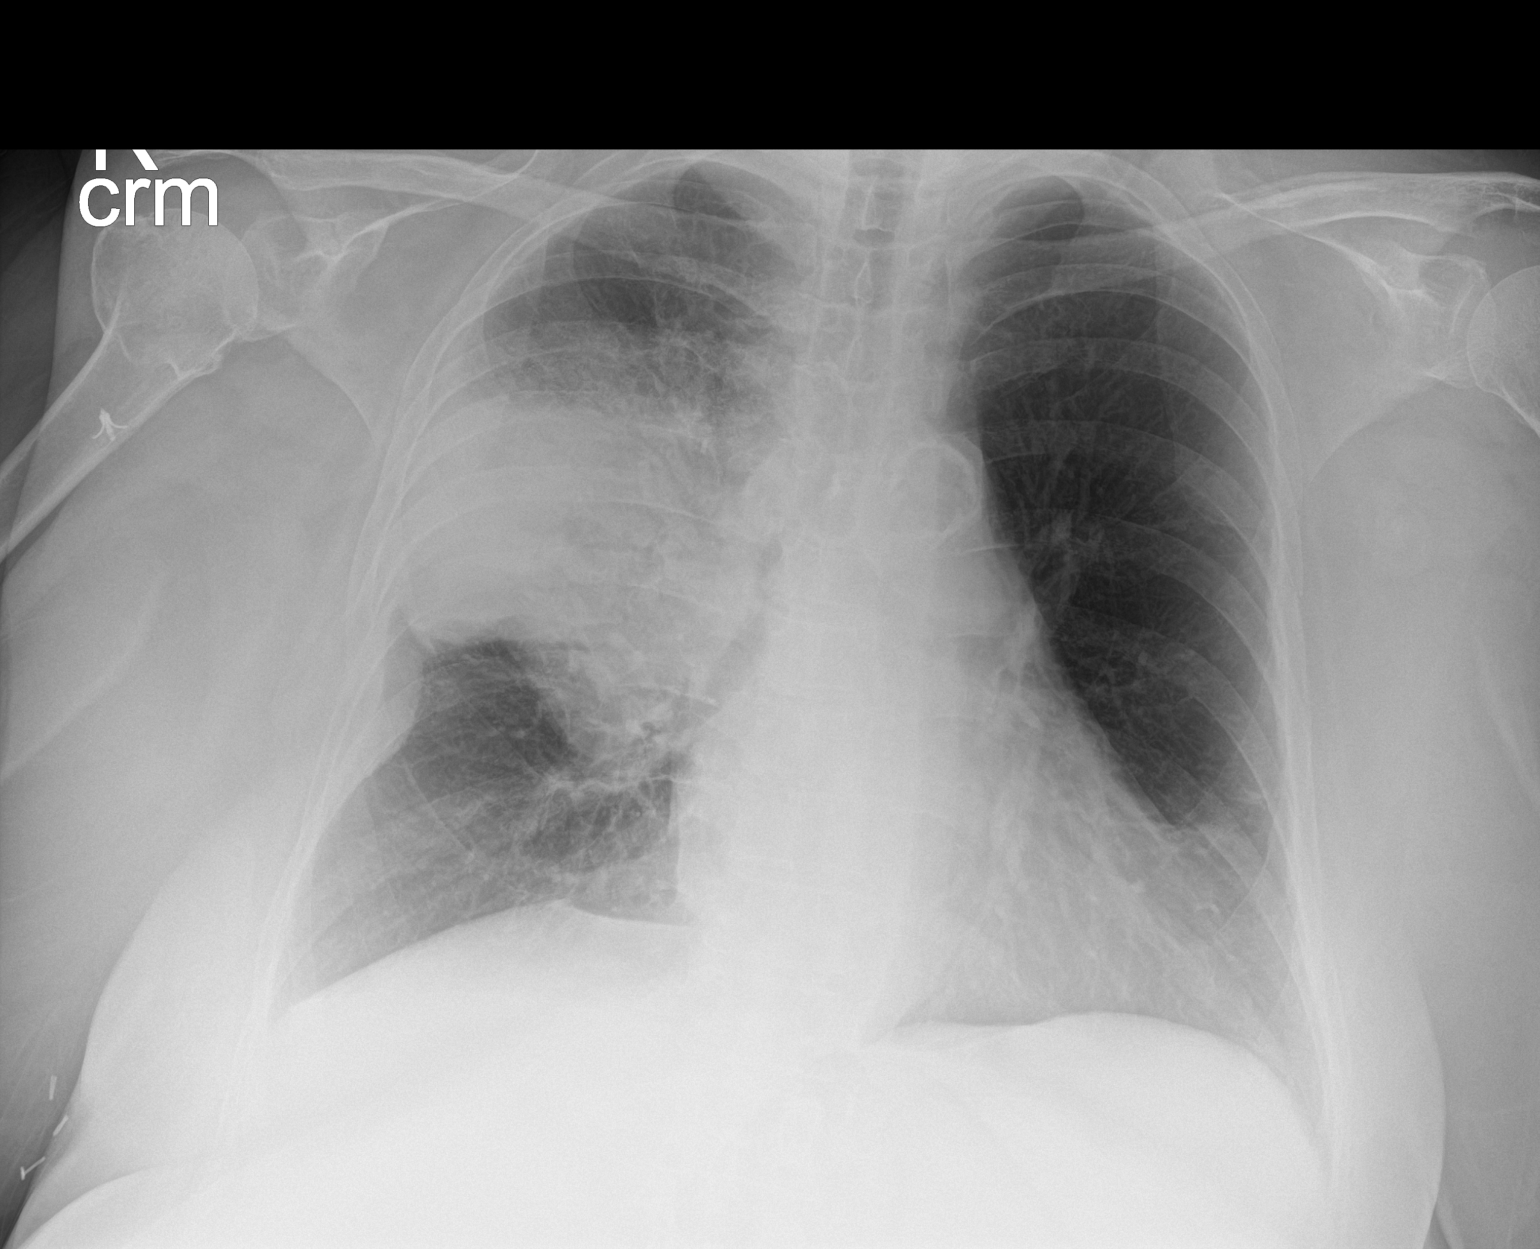

[1 of 1 positions shown; findings below may reference images not displayed]

FINDINGS: The large central right upper lobe mass is larger. Opacity extending
to the lateral right chest wall is associated. Heterogeneous
opacities surrounding the mass extending towards the right apex have
increased worrisome for postobstructive pneumonitis. A destructive
lesion of a right lateral mid rib is worse with enlargement of the
associated soft tissue mass. No Kerley B lines to suggest volume
overload. Vascularity is within normal limits. Heart is normal in
size. No pneumothorax. No pleural effusion.
IMPRESSION: Large right upper lobe mass is worse. Associated postobstructive
pneumonitis is worse.

No evidence of CHF.
